# Patient Record
Sex: Female | Born: 1975
Health system: Southern US, Community
[De-identification: ages and names within clinical notes are randomized; demographics above are authoritative.]

## PROBLEM LIST (undated history)

## (undated) DIAGNOSIS — R030 Elevated blood-pressure reading, without diagnosis of hypertension: Secondary | ICD-10-CM

## (undated) DIAGNOSIS — F419 Anxiety disorder, unspecified: Secondary | ICD-10-CM

## (undated) DIAGNOSIS — D649 Anemia, unspecified: Secondary | ICD-10-CM

## (undated) DIAGNOSIS — R21 Rash and other nonspecific skin eruption: Secondary | ICD-10-CM

## (undated) DIAGNOSIS — Z8614 Personal history of Methicillin resistant Staphylococcus aureus infection: Secondary | ICD-10-CM

## (undated) DIAGNOSIS — M1990 Unspecified osteoarthritis, unspecified site: Secondary | ICD-10-CM

## (undated) DIAGNOSIS — L732 Hidradenitis suppurativa: Secondary | ICD-10-CM

## (undated) DIAGNOSIS — Z8489 Family history of other specified conditions: Secondary | ICD-10-CM

## (undated) DIAGNOSIS — T783XXA Angioneurotic edema, initial encounter: Secondary | ICD-10-CM

## (undated) DIAGNOSIS — T7840XA Allergy, unspecified, initial encounter: Secondary | ICD-10-CM

## (undated) DIAGNOSIS — E669 Obesity, unspecified: Secondary | ICD-10-CM

## (undated) HISTORY — DX: Obesity, unspecified: E66.9

## (undated) HISTORY — DX: Anxiety disorder, unspecified: F41.9

## (undated) HISTORY — DX: Allergy, unspecified, initial encounter: T78.40XA

## (undated) HISTORY — PX: WISDOM TOOTH EXTRACTION: SHX21

## (undated) HISTORY — DX: Unspecified osteoarthritis, unspecified site: M19.90

## (undated) HISTORY — DX: Angioneurotic edema, initial encounter: T78.3XXA

## (undated) HISTORY — DX: Personal history of Methicillin resistant Staphylococcus aureus infection: Z86.14

## (undated) HISTORY — PX: UPPER GI ENDOSCOPY: SHX6162

---

## 1996-09-06 HISTORY — PX: CHOLECYSTECTOMY: SHX55

## 1999-09-30 ENCOUNTER — Inpatient Hospital Stay (HOSPITAL_COMMUNITY): Admission: EM | Admit: 1999-09-30 | Discharge: 1999-09-30 | Payer: Self-pay | Admitting: Emergency Medicine

## 1999-09-30 HISTORY — PX: INCISION AND DRAINAGE ABSCESS: SHX5864

## 2000-07-15 ENCOUNTER — Other Ambulatory Visit: Admission: RE | Admit: 2000-07-15 | Discharge: 2000-07-15 | Payer: Self-pay | Admitting: Obstetrics and Gynecology

## 2001-03-15 ENCOUNTER — Emergency Department (HOSPITAL_COMMUNITY): Admission: EM | Admit: 2001-03-15 | Discharge: 2001-03-15 | Payer: Self-pay | Admitting: Emergency Medicine

## 2001-03-16 ENCOUNTER — Inpatient Hospital Stay (HOSPITAL_COMMUNITY): Admission: RE | Admit: 2001-03-16 | Discharge: 2001-03-17 | Payer: Self-pay | Admitting: Surgery

## 2001-03-16 HISTORY — PX: INCISION AND DRAINAGE ABSCESS: SHX5864

## 2001-06-21 ENCOUNTER — Other Ambulatory Visit: Admission: RE | Admit: 2001-06-21 | Discharge: 2001-06-21 | Payer: Self-pay | Admitting: Obstetrics and Gynecology

## 2001-09-10 ENCOUNTER — Emergency Department (HOSPITAL_COMMUNITY): Admission: EM | Admit: 2001-09-10 | Discharge: 2001-09-11 | Payer: Self-pay | Admitting: Emergency Medicine

## 2002-05-07 ENCOUNTER — Ambulatory Visit (HOSPITAL_COMMUNITY): Admission: EM | Admit: 2002-05-07 | Discharge: 2002-05-07 | Payer: Self-pay | Admitting: Emergency Medicine

## 2002-05-07 HISTORY — PX: INCISION AND DRAINAGE ABSCESS: SHX5864

## 2003-05-27 ENCOUNTER — Encounter: Payer: Self-pay | Admitting: Emergency Medicine

## 2003-05-27 ENCOUNTER — Emergency Department (HOSPITAL_COMMUNITY): Admission: EM | Admit: 2003-05-27 | Discharge: 2003-05-27 | Payer: Self-pay | Admitting: Emergency Medicine

## 2004-04-08 ENCOUNTER — Other Ambulatory Visit: Admission: RE | Admit: 2004-04-08 | Discharge: 2004-04-08 | Payer: Self-pay | Admitting: Obstetrics and Gynecology

## 2004-12-07 ENCOUNTER — Emergency Department (HOSPITAL_COMMUNITY): Admission: EM | Admit: 2004-12-07 | Discharge: 2004-12-07 | Payer: Self-pay | Admitting: Emergency Medicine

## 2006-11-11 ENCOUNTER — Other Ambulatory Visit: Admission: RE | Admit: 2006-11-11 | Discharge: 2006-11-11 | Payer: Self-pay | Admitting: Obstetrics & Gynecology

## 2007-01-16 ENCOUNTER — Emergency Department (HOSPITAL_COMMUNITY): Admission: EM | Admit: 2007-01-16 | Discharge: 2007-01-16 | Payer: Self-pay | Admitting: Emergency Medicine

## 2007-04-25 ENCOUNTER — Ambulatory Visit (HOSPITAL_COMMUNITY): Admission: RE | Admit: 2007-04-25 | Discharge: 2007-04-25 | Payer: Self-pay | Admitting: Family Medicine

## 2007-09-07 DIAGNOSIS — Z8614 Personal history of Methicillin resistant Staphylococcus aureus infection: Secondary | ICD-10-CM

## 2007-09-07 HISTORY — DX: Personal history of Methicillin resistant Staphylococcus aureus infection: Z86.14

## 2007-11-16 ENCOUNTER — Other Ambulatory Visit: Admission: RE | Admit: 2007-11-16 | Discharge: 2007-11-16 | Payer: Self-pay | Admitting: Obstetrics & Gynecology

## 2008-07-08 ENCOUNTER — Encounter (INDEPENDENT_AMBULATORY_CARE_PROVIDER_SITE_OTHER): Payer: Self-pay | Admitting: Neurology

## 2008-07-08 ENCOUNTER — Inpatient Hospital Stay (HOSPITAL_COMMUNITY): Admission: AD | Admit: 2008-07-08 | Discharge: 2008-07-09 | Payer: Self-pay | Admitting: Neurology

## 2008-07-09 ENCOUNTER — Encounter (INDEPENDENT_AMBULATORY_CARE_PROVIDER_SITE_OTHER): Payer: Self-pay | Admitting: Neurology

## 2008-11-21 ENCOUNTER — Other Ambulatory Visit: Admission: RE | Admit: 2008-11-21 | Discharge: 2008-11-21 | Payer: Self-pay | Admitting: Obstetrics & Gynecology

## 2008-12-04 ENCOUNTER — Ambulatory Visit (HOSPITAL_COMMUNITY): Admission: RE | Admit: 2008-12-04 | Discharge: 2008-12-04 | Payer: Self-pay | Admitting: *Deleted

## 2009-11-03 ENCOUNTER — Ambulatory Visit (HOSPITAL_BASED_OUTPATIENT_CLINIC_OR_DEPARTMENT_OTHER): Admission: RE | Admit: 2009-11-03 | Discharge: 2009-11-03 | Payer: Self-pay | Admitting: Specialist

## 2009-11-03 HISTORY — PX: AXILLARY HIDRADENITIS EXCISION: SUR522

## 2010-01-26 ENCOUNTER — Ambulatory Visit (HOSPITAL_BASED_OUTPATIENT_CLINIC_OR_DEPARTMENT_OTHER): Admission: RE | Admit: 2010-01-26 | Discharge: 2010-01-26 | Payer: Self-pay | Admitting: Specialist

## 2010-01-26 HISTORY — PX: AXILLARY HIDRADENITIS EXCISION: SUR522

## 2010-09-27 ENCOUNTER — Encounter: Payer: Self-pay | Admitting: *Deleted

## 2010-09-27 ENCOUNTER — Encounter: Payer: Self-pay | Admitting: Neurology

## 2010-11-23 LAB — CBC
HCT: 35.9 % — ABNORMAL LOW (ref 36.0–46.0)
Hemoglobin: 11.6 g/dL — ABNORMAL LOW (ref 12.0–15.0)
MCHC: 32.5 g/dL (ref 30.0–36.0)
MCV: 82.2 fL (ref 78.0–100.0)
Platelets: 286 10*3/uL (ref 150–400)
RDW: 17 % — ABNORMAL HIGH (ref 11.5–15.5)

## 2010-11-23 LAB — DIFFERENTIAL
Basophils Absolute: 0 10*3/uL (ref 0.0–0.1)
Basophils Relative: 0 % (ref 0–1)
Eosinophils Absolute: 0.2 10*3/uL (ref 0.0–0.7)
Eosinophils Relative: 3 % (ref 0–5)
Lymphocytes Relative: 40 % (ref 12–46)
Monocytes Absolute: 0.5 10*3/uL (ref 0.1–1.0)

## 2010-11-23 LAB — BASIC METABOLIC PANEL
BUN: 9 mg/dL (ref 6–23)
CO2: 26 mEq/L (ref 19–32)
GFR calc non Af Amer: >60 mL/min (ref >60)
Glucose, Bld: 93 mg/dL (ref 70–99)
Potassium: 4.5 mEq/L (ref 3.5–5.1)
Sodium: 137 mEq/L (ref 135–145)

## 2010-11-27 LAB — POCT HEMOGLOBIN-HEMACUE: Hemoglobin: 10.7 g/dL — ABNORMAL LOW (ref 12.0–15.0)

## 2011-01-06 LAB — HM MAMMOGRAPHY

## 2011-01-19 NOTE — Discharge Summary (Signed)
[name], [name]             ACCOUNT NO.:  [id number]   MEDICAL RECORD NO.:  [id number]          PATIENT TYPE:  INP   LOCATION:  3034                         FACILITY:  [facility]   PHYSICIAN:  Pramod P. Pearlean Brownie, MD    DATE OF BIRTH:  Jan 07, 1976   DATE OF ADMISSION:  07/08/2008  DATE OF DISCHARGE:  07/09/2008                               DISCHARGE SUMMARY   DIAGNOSIS AT TIME OF DISCHARGE:  1. Complicated migraine status post intravenous tissue plasminogen      activator for stroke-like symptoms.  2. Obesity.  3. Mild hyperlipidemia.  4. Gastroesophageal reflux disease.  5. History of migraine.  6. History of anxiety attack about 5 years ago.  7. Cholecystectomy with drainage of abscess.   MEDICINES AT TIME OF DISCHARGE:  NuvaRing for birth control and aspirin  81 mg a day.   STUDIES PERFORMED:  1. CT of the brain on admission showed no acute abnormality.  2. MRI of the brain shows no acute stroke.  There is mild-to-moderate      inflammatory thickening in mucosa and ethmoid air cells.  3. MRA of the brain shows severely decreased caliber involving the      right anterior cerebral artery, A1 segment.  Moderate decreased      caliber involving the superior division of the right middle      cerebral artery proximally.  Atherosclerosis versus vasculitis.  4. Carotid Doppler shows bilateral 40-60% distal internal carotid      artery stenosis by velocity only.  No plaque morphology.  Tortuous      distal internal carotid vessels probably reason for increased      velocities.  Vertebral artery flow was antegrade.  5. A 2-D echocardiogram performed, results pending.  6. EKG shows normal sinus rhythm.   LABORATORY STUDIES:  Cholesterol 194, triglycerides 92, HDL 66, LDL 110.  Hemoglobin A1c 5.9.  Cardiac enzymes negative.  Chemistry with glucose  119, potassium 3.3, otherwise, normal.  Coagulation studies were normal.  CBC with hemoglobin 11.8, otherwise, normal albumin was 2.8.   Urinalysis  was 7-10 white blood cells, 0-2 red blood cells, few bacteria.  No  leukocyte esterase noted.  Urine drug screen negative.  Urine pregnancy  test negative, contains albumin to a 2.8.   HISTORY OF PRESENT ILLNESS:  [name] is a 35 year old obese  African American female with no other past medical history.  The patient  states she was awake about 5:15 and was going through her usual routine.  She said she was on the toilet at 6:30.  When she reached for the toilet  paper, found that her hand was clumsy to the point she was unable to  manipulate the paper wipe herself.  She called for her husband who found  that her speech was slurring, as she was having difficulty finding  words.  She was brought to [facility] Emergency Department about 8:00  a.m. where her speech was somewhat improved but had persistent symptoms  of weakness in the right upper extremity.  She said she had a little bit  of numbness in the  right foot, but no obvious weakness was present in  her gait.  She denied any history of previous symptoms.  She is now  developing a posterior headache, which is not throbbing and not  associated with nausea or vomiting.  She has a history of migraines and  feels the pain is a little bit different than in her past migraines.  Her CT of the head was negative for acute abnormalities.  She was felt  to be a t-PA candidate and full-dose IV t-PA was given.  She was  transferred to [facility] for further stroke evaluation.   HOSPITAL COURSE:  MRI was negative for acute stroke.  It was felt her  symptoms are likely that of a complicated migraine, as she does have a  history of migraines in the past with a recent medication administration  by her primary care physician, which she has not followed through with,  as it was a medicine labeled for depression and she was uncomfortable  taking this.  She had a complete stroke workup done.  No obvious source  of stroke or stroke  symptoms found.  We would like to do an outpatient  TCD bubble emboli monitoring study to look for patent foramen ovale,  which may or may not be associated with her migraines.  She was placed  on aspirin 81 mg a day in the hospital for primary stroke prevention and  asked to discuss use of NuvaRing and other estrogen-related birth  control products with her OB/GYN, as we would prefer she not have these  in case this ends up to be stroke related.  I have asked her to follow  up with her primary care physician, OB/GYN, and Dr. Pearlean Brownie for the TCD  bubble study.  She has the rehab therapy needs.   CONDITION AT DISCHARGE:  The patient is alert and oriented x3.  No  aphasia.  No dysarthria.  Eye movements are full.  Face is symmetric.  Tongue is midline.  She has no drift, no focal weakness.  Her deep  tendon reflexes are 3+.  Her plantars are down bilaterally.  Her heart  rate is regular.  Her breath sounds are clear.   DISCHARGE PLAN:  1. Discharge home with family.  2. No therapy needs.  3. Add aspirin 81 mg a day for stroke prevention.  4. Prefer no estrogen-containing birth control products.  I have asked      her to follow up with her OB/GYN and has followup with primary care      physician, Dr. Modena Jansky within 1 month.  5. Call Dr. Marlis Edelson office and schedule an outpatient TCD bubble study      and emboli monitoring within the next month.  She will see him at      that time.      Annie Main, N.P.    ______________________________  Sunny Schlein. Pearlean Brownie, MD    SB/MEDQ  D:  07/09/2008  T:  07/10/2008  Job:  [id number]   cc:   Dr. Alois Cliche

## 2011-01-19 NOTE — H&P (Signed)
[name], [name]             ACCOUNT NO.:  [id number]   MEDICAL RECORD NO.:  [id number]          PATIENT TYPE:  EMS   LOCATION:  ED                           FACILITY:  [facility]   PHYSICIAN:  Michael L. Reynolds, M.D.DATE OF BIRTH:  1976/01/24   DATE OF ADMISSION:  07/08/2008  DATE OF DISCHARGE:                              HISTORY & PHYSICAL   CHIEF COMPLAINT:  Code stroke with right-sided weakness and slurred  speech.   HISTORY OF PRESENT ILLNESS:  This is one of the several [facility] admissions in the first Stroke Service admission for  this 35 year old woman with little past medical history.  She states  that she was awake this morning at about 5:15 and was going through the  usual routine.  She said she was on the toilet at 6:30, when she reached  the toilet paper, found that her hand was clumsy to the point that she  was unable to manipulate the paper and wipe herself.  She called for her  husband, does found that her speech was slurring and she was having  difficulty finding words.  She was brought to [facility] Emergency  Department about 8 a.m. where her speech was somewhat better, but she  had persistent symptoms of weakness in the right upper extremity.  She  said that she had little bit of numbness in the right foot, but no  obvious weakness and was able to walk normally.  She denied any history  of previous similar symptoms.  She now is developing a posterior  headache, which is not throbbing and not associated with nausea or  vomiting.  She has had migraines in the past, but feels the pain is a  little bit different from her past migraines.  She denies chest pain,  palpitations, shortness of breath, or altered level of consciousness.   PAST MEDICAL HISTORY:  Remarkable for gastroesophageal reflux disease,  which she is on medications.  She is morbidly obese.  She has history of  migraines in the past, although these have not bothered her very  much  recently.  She says about 5 years ago she developed some arm numbness,  and was brought to the ED for possible stroke, but was diagnosed with  anxiety attack and sent home.  She says her migraines have been severe  enough that she has passed out in the past.  She had a cholecystectomy  in the past, and subsequently had few umbilical abscess which have been  surgically drained.   FAMILY HISTORY:  Negative for any premature history of coronary artery  disease or stroke.   SOCIAL HISTORY:  She relaxes with alcohol, denies any history of tobacco  or illicit drug use.  She lives with her husband and is normally  independent on her activities of daily living.   ALLERGIES:  No known drug allergies.   MEDICATIONS:  Protonix.   REVIEW OF SYSTEMS:  Headache as above, some anxiety symptoms have  started, beyond that full 10 system review of systems is negative except  as outlined in the HPI and in the  accompanying admission nursing record.   PHYSICAL EXAMINATION:  VITAL SIGNS:  Temperature 98.2, blood pressure  147/88, pulse 95, and respirations 18.  GENERAL:  This is a morbidly obese, healthy-appearing woman, anxious and  tearful, supine in the hospital bed, and no evident distress.  HEENT:  Head, cranium is normocephalic and atraumatic.  Oropharynx  benign.  NECK:  Supple without carotid or supraclavicular bruits.  HEART:  Regular rate without murmurs.  CHEST:  Clear to auscultation bilaterally.  ABDOMEN:  Obese and soft.  Normoactive bowel sounds.  EXTREMITIES:  No edema.  NEUROLOGICAL:  Mental status, she is awake and alert.  She is oriented  to place and time.  She is able to name objects and repeat phrases  without difficulty.  She is able to follow 1- to 2-step commands.  Mood  is anxious and affect is congruent with that.  CRANIAL NERVES:  Pupils are equal and reactive.  Extraocular movements  are full without nystagmus.  Visual fields are full to confrontation.  Hearing is  intact to conversation and speech.  Facial sensation is  intact to pinprick.  Face, tongue, and palate move normally and  symmetrically.  Motor, normal bulk and tone.  She has slight drift to  the right upper extremity, otherwise no drift.  In the right hand, she  has weakness which is fairly inconsistent in character.  Sensory, she  reports intact pinprick sensation in all extremities.  Double  stimulation is intact.  Coordination and rapid movements are performed  well on the left and cannot to be performed well on the right.  She  performs finger-to-nose well on the left, performs it on the right  without ataxia although the hand and fingers droop.  Reflexes are brisk  throughout.  Toes are downgoing bilaterally.  Gait, she is able to walk  with some assistant from her husband.   LABORATORY REVIEW:  CBC; white count 6.6, hemoglobin 11.9, and platelets  342,000.  Chemistries are remarkable for limited elevated glucose of  103, normal electrolytes, normal BUN and creatinine, normal liver  functions.  Cardiac enzymes are negative.  Coags are normal.  EKG is  normal.  CT of the head first reviewed is normal.  Urine pregnancy test  is negative.   IMPRESSION:  Acute right hand weakness and clumsiness, possible stroke  versus complicated migraine versus anxiety.   PLAN:  We will administer TPA, then treat with the usual post TPA  protocol.  She needs an MRI of the brain and a routine stroke workup.  If the MRI is negative, consider migraine or other source of the focal  symptoms.  Stroke Service to follow.      Michael L. Thad Ranger, M.D.  Electronically Signed     MLR/MEDQ  D:  07/08/2008  T:  07/09/2008  Job:  [id number]

## 2011-01-22 NOTE — Op Note (Signed)
[facility]  Patient:    [name], [name]                        MRN: [id number] Proc. Date: 03/16/01 Adm. Date:  [id number] Disc. Date: [id number] Attending:  Molpus, Carlisle Beers CC:         Lilly Cove, M.D.   Operative Report  DATE OF BIRTH:  [birth year].  PREOPERATIVE DIAGNOSIS:  Infraumbilical abscess.  POSTOPERATIVE DIAGNOSIS:  Infraumbilical abscess approximately 6 cm in diameter.  PROCEDURE:  Incision and drainage of infraumbilical abscess.  SURGEON:  Sandria Bales. Ezzard Standing, M.D.  ANESTHESIA:  General endotracheal.  INDICATION FOR PROCEDURE:  [name] is a 35 year old black female who had a laparoscopic cholecystectomy in [place], [place] in November 1998.  She developed an infraumbilical abscess, drained by Vikki Ports, M.D., in January 2001.  The cause of this abscess is not clear, but she now re-presented with another infraumbilical abscess and was seen by Dr. _____ in the urgent clinic, and I was on call and took care of the patient.  DESCRIPTION OF PROCEDURE:  Patient placed in a supine position.  After a general endotracheal anesthetic supervised by Lucille Passy, M.D., the abdomen was prepped with Betadine solution and sterilely draped.  I used an 18-gauge needle to probe and find the abscess cavity.  I actually excised her old infraumbilical scar pretty much, got down into a cavity, which was probably about 5-6 cm in diameter, with maybe 40-50 cc of foul-smelling yellow pus.  Cultures were obtained and sent.  I then irrigated the wound out, found no obvious foreign body within this, packed it with Betadine-soaked gauze, and then sterilely dressed it.  The patient tolerated the procedure well, was transported to the recovery room in good condition.  Sponge and needle count correct at the end of the case. DD:  03/16/01 TD:  03/17/01 Job: [id number]

## 2011-01-22 NOTE — H&P (Signed)
[facility]  Patient:    [name], [name]                        MRN: [id number] Adm. Date:  [id number] Disc. Date: [id number] Attending:  Molpus, Carlisle Beers CC:         Lilly Cove, M.D.   History and Physical  DATE OF BIRTH:  01/17/1976  HISTORY OF PRESENT ILLNESS:  This is a 35 year old black female who has been seen formerly in our office by Dr. Danna Hefty in January of 2001.  Her history is that in November of 1998 while living in [place], [place], she underwent a laparoscopic cholecystectomy. As best described, this was an eventful operation. She did well until January of 2001 when she presented with a periumbilical abscess and which Dr. Luan Pulling took her to the operating room and had to do an incision and drainage of this abscess. This abscess eventually healed. She has had no symptoms until the last day or two when she had increasing infraumbilical pain again. She went to the emergency room, I think yesterday, and was given some oral antibiotics but had worsening pain and saw Dr. Karilyn Cota this morning who put her on Augmentin and then asked her to be seen in the Urgent Clinic. She was seen by Dr. Lindie Spruce in the Urgent Clinic. He called me and asked me to see her and take her to the operating room tonight.  She has no other GI symptoms, no history of peptic ulcer disease, liver disease, pancreatic disease or change in bowel habits. Her only prior abdominal surgery was this laparoscopic cholecystectomy in 1998.  ALLERGIES:  She has no allergies.  MEDICATIONS:  She is on no medicines other than her acute medicines.  REVIEW OF SYSTEMS:  Negative for significant pulmonary, cardiac, gastrointestinal and urologic problems.  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  PHYSICAL EXAMINATION:  VITAL SIGNS:  Temperature 98.3, pulse 110, blood pressure 130/70, respirations 18.  GENERAL:  She is a well-nourished,  mildly obese black female, alert and cooperative on physical examination.  HEENT:  Unremarkable.  NECK:  Supple.  LUNGS:  Clear to auscultation.  HEART:  Has a regular rate and rhythm.  BREASTS:  I did not examine her breasts.  ABDOMEN:  Obese. On the lower umbilicus, she has about a 7-8 cm firm tender mass with a scar which is transverse below the umbilicus. The transverse scar is about 4-5 cm in length. She has not other palpable abdominal masses but certainly consistent with an infection probably in an old infraumbilical incision. The etiology is still unclear.  EXTREMITIES:  She has good strength in all four extremities.  IMPRESSION:  Infraumbilical abscess. I spoke to the patient about going to the operating room. She really could not tolerate this thing being drained in our office and I think we will need general anesthesia to drain this. Will probably plan to keep her overnight since it is already late in the evening getting started with plans to be discharged home tomorrow with home health care assisting with her wound care. DD:  03/16/01 TD:  03/17/01 Job: [id number]

## 2011-01-22 NOTE — H&P (Signed)
NAME:  [name] NO.:  [id number]   MEDICAL RECORD NO.:  [id number]                   PATIENT TYPE:  EMS   LOCATION:  ED                                   FACILITY:  [facility]   PHYSICIAN:  Angelia Mould. Derrell Lolling, M.D.             DATE OF BIRTH:  Dec 16, 1975   DATE OF ADMISSION:  05/07/2002  DATE OF DISCHARGE:                                HISTORY & PHYSICAL   CHIEF COMPLAINT:  Pain and swelling at the umbilicus.   HISTORY OF PRESENT ILLNESS:  This is a 35 year old black female, who has had  previous infections at her umbilicus, and presents to the [facility] ER  with what appears to be another incisional abscess at the umbilicus.   She underwent a laparoscopic cholecystectomy in November 1998, while living  in [place] and apparently this was uneventful.  She presented with  a periumbilical abscess in January 2001, and Dr. Luan Pulling took her to the  operating room and drained this, and this eventually healed.  She came back  on March 16, 2001, and saw Dr. Ovidio Kin with a recurrent abscess, and he  took her to the operating room on March 16, 2002.  This eventually healed.  She now presents with a two day history of swelling and pain and redness  around the umbilicus similar to previous episodes.  She has not had any  fever or chills.  I was asked to see her by Dr. Effie Shy, in the emergency  room.   PAST HISTORY:  1. Laparoscopic cholecystectomy in 1998.  2. Incision and drainage of umbilical incisional abscess, 2001.  3. Incision and drainage of umbilical incisional abscess July 2002.  4. Morbid obesity.  5. She has no other medical problems, specifically denies hypertension or     diabetes, or sickle cell trait or disease.  She denies asthma.   CURRENT MEDICATIONS:  None.   DRUG ALLERGIES:  None known.   SOCIAL HISTORY:  The patient is an unemployed Airline pilot.  She lives in  [place].  She is single, lives alone, and has no  children.  Denies the  use of alcohol or tobacco.   FAMILY HISTORY:  Mother living, has hypertension.  Father living and well.  There is colon cancer and lung cancer in more remote relatives.   REVIEW OF SYSTEMS:  All systems are reviewed and are negative except as  described above.   PHYSICAL EXAMINATION:  GENERAL:  A morbidly obese black female, very  pleasant, in no distress.  VITAL SIGNS:  Temp. 98.6, pulse 104, respirations 18, blood pressure 133/85.  HEENT:  Sclerae clear.  Extraocular movements intact.  Oropharynx clear  without lesions.  NECK:  Supple, nontender.  No mass.  No bruits.  LUNGS:  Clear to auscultation.  HEART:  Regular rate and rhythm.  No murmur.  BREASTS:  Not examined.  ABDOMEN:  Morbidly obese and soft  and nontender with active bowel sounds.  The only abnormality is in the periumbilical region.  There are multiple  hypertrophied scars below the umbilicus.  There is erythema surrounding this  area about 8-10 cm, and there is about a 3-4 cm complex fluctuant area.  There is no skin necrosis.  EXTREMITIES:  No edema.  Good pulses.  NEUROLOGIC:  Within normal limits.   IMPRESSION:  Recurrent incisional abscess, periumbilical.   PLAN:  The patient will be taken to the operating room for exploration and  drainage of her umbilical abscess.   I have discussed the indications and details of surgery with her.  Risks and  complications have been outlined, including but not limited to, bleeding;  recurrent infection, which is certainly a risk because of her history and  obesity; injury to adjacent structures such as the intestinal tract which is  unlikely to possible, and other unperceived problems.  She seems to  understand these issues well.  At this time, all of her questions are  answered.  She is in full agreement with this plan.                                               Angelia Mould. Derrell Lolling, M.D.    HMI/MEDQ  D:  05/07/2002  T:  05/07/2002  Job:   [id number]   cc:   Wilson Singer, M.D.

## 2011-01-22 NOTE — Op Note (Signed)
[place]  Patient:    [name]                         MRN: [id number] Proc. Date: 09/30/99 Adm. Date:  [id number] Disc. Date: [id number] Attending:  Stephenie Acres                           Operative Report  PREOPERATIVE DIAGNOSIS:  Abdominal wall mass, rule out hernia.  POSTOPERATIVE DIAGNOSIS:  Periumbilical abscess.  OPERATION PERFORMED:  Incision and drainage of periumbilical abscess.  SURGEON:  Stephenie Acres, M.D.  ANESTHESIA:  General.  DESCRIPTION OF PROCEDURE:  The patient was taken to the operating room and placed in supine position.  After adequate general anesthesia was induced the abdomen as prepped and draped in normal sterile fashion.  Using a transverse infraumbilical incision, dissecting down onto the mass, I exposed an area of significant purulence.  This was drained widely and packed.  The skin was closed loosely with staples.  A sterile dressing was applied.  The patient tolerated the procedure ell and went to PACU in good condition. DD:  10/30/99 TD:  10/30/99 Job: [id number]

## 2011-01-22 NOTE — Op Note (Signed)
NAME:  Doristine Church NO.:  [id number]   MEDICAL RECORD NO.:  [id number]                   PATIENT TYPE:  INP   LOCATION:  0101                                 FACILITY:  [facility]   PHYSICIAN:  Angelia Mould. Derrell Lolling, M.D.             DATE OF BIRTH:  December 20, 1975   DATE OF PROCEDURE:  05/07/2002  DATE OF DISCHARGE:                                 OPERATIVE REPORT   PREOPERATIVE DIAGNOSIS:  Recurrent abdominal wall abscess.   POSTOPERATIVE DIAGNOSIS:  Recurrent abdominal wall abscess.   OPERATION PERFORMED:  Incision and drainage of abdominal wall abscess.   SURGEON:  Angelia Mould. Derrell Lolling, M.D.   OPERATIVE INDICATIONS:  This is a 35 year old white female who underwent a  laparoscopic cholecystectomy in 1998 in [place].  She developed an  abscess in her umbilical incision in 2001, and this was drained by Dr.  Luan Pulling (that healed).  She developed another abscess in the umbilical  incision in July 2002, and that was drained by Dr. Ovidio Kin.  That  incision also healed.   She presented today with a two-day history of redness, pain and swelling in  the periumbilical area.  On examination she has another abscess.  The scar  is complex and hypertrophied.  She is brought to the operating room for  exploration and drainage of her complex abdominal wall abscess under general  anesthesia.   OPERATIVE TECHNIQUE:  Following the induction of general endotracheal  anesthesia, the patient's abdomen was prepped and draped in a sterile  fashion.  Marcaine 0.5% with epinephrine was used as a local supplemental  infiltration anesthetic.  A curved transverse incision was made in the  infraumbilical area, excising part of the old scar.  I entered a large  abscess cavity, which was cultured.  I explored the wound thoroughly,  looking for foreign bodies -- I did not find any for sure.  There were  several fragments of black tissue, but I could not tell whether this  was  thrombus or old silk suture material.  The fascia was intact.  The wound was  irrigated with saline.  Bleeders were cauterized with cautery and hemostasis  was good.  After thoroughly irrigating and thoroughly cauterizing the wound,  we packed the wound with saline-moistened fine mesh gauze and covered with a  bulky absorbant bandage.  The patient tolerated the procedure well and was  taken to the recovery room in stable condition.   ESTIMATED BLOOD LOSS:  About 20 cc.   COMPLICATIONS:  None.   COUNTS:  Sponge, needle and instrument counts were correct.                                               Angelia Mould. Derrell Lolling, M.D.   HMI/MEDQ  D:  05/07/2002  T:  05/07/2002  Job:  [id number]   cc:   Wilson Singer, M.D.

## 2011-01-22 NOTE — Letter (Signed)
December 12, 2008     RE:   GRAHAM, DOUKAS  MR#   [id number]  ACC#        [id number]   MFM CONSULTATION REPORT   Dear Dr. Hyacinth Meeker:   I first want to thank you for extending your patient, [name] to  me for a prepregnancy consultation.  As you know, she is a 35 year old  gravida 0 female with a interesting history of severe migraine headache.  The patient's history is the following.  The patient was diagnosed with  severe migraine headache in November 2009.  At that time, she was  admitted to the [facility] when she noticed a sudden onset of  clumsiness and weakness in her right hand as well as some slurred speech  and difficulty finding words.  She was worked up by Neurology including  an MRI scan of the brain and was subsequently diagnosed with a  complicated migraine episode.  She was placed on Topamax 50 mg per day  for her headaches and reports that the Topamax has worked quite well for  controlling her headaches.  She reports that she also have been tried on  Imitrex in the past without significant improvement of her migraine  headache.  Per the patient's report when she does have onset of severe  migraine headaches, they are extremely debilitating to her.   PAST MEDICAL HISTORY:  Otherwise, significant for obesity, some  borderline hyperlipidemia, and gastroesophageal reflux disease for which  she takes Protonix.   PAST SURGICAL HISTORY:  Gallbladder removal.   MEDICATIONS:  1. NuvaRing which she plans to have removed.  2. Topamax 50 mg nightly.  3. Protonix 40 mg daily.  4. Low-dose baby aspirin 81 mg a day.  5. Folic acid supplementation.   SOCIAL HISTORY:  She is employed full time.  She is married and does not  smoke or drink excessive alcohol beverages.   I had a long discussion with her concerning the management of migraine  headaches during pregnancy.  I discussed with her that I clearly do not  feel as though her history of severe migraine headache  would be viewed  as a contraindication to undertaking a subsequent pregnancy.  She  reports that having being told by a nurse practitioner that she should  consider never undertaking a pregnancy due to her history of severe  migraine headache.  In addition, I talked to her about general  management of migraine headaches during pregnancy and that we have  multiple options that we could use in treatment of migraine headaches  during pregnancy.  I discussed that narcotics are not contraindicated,  but would not be the first-line therapy which we would use.  In  addition, we could attempt to control migraine headaches with  nonsteroidal antiinflammatory agents up through the third trimester and  also can use Fioricet.  I discussed that ergotamine is absolutely  contraindicated during pregnancy.  We did have a long discussion  concerning the potential to stay on the Topamax during any subsequent  pregnancy.  She reports that there has been discussion of stopping the  Topamax during any subsequent pregnancy.  I gave her information from  [employer] discussing the use of this drug during pregnancy.  Specifically, we discussed that Topamax has been noted to produce  abnormal pregnancy outcomes in experimental animals, but, however, human  case reports and case series have not shown a clear increase in the  enhancement of congenital anomalies; however, the experience with this  drug is as of yet quite limited.  I did discuss that should she  successfully conceive, we would want to do an up-to-date literature  search to see if there is any further information about Topamax during  pregnancy.  Based upon the severity of her symptoms and the apparent  utility of Topamax with her, it would be my advice to keep her on the  Topamax during the pregnancy acknowledging that there is a theoretical  risk of malformations with the drug, the absolute risk of that being  probably low but not completely defined.   The other alternative is for  her to come off the Topamax during the first trimester and then to  resume it in the second trimester.  I did discuss with her that some  women actually reports improved symptoms of migraine headaches during  the pregnancy and that we managed many patients with severe migraines  during the gestation.   It will be my anticipation that we would co-manage her pregnancy along  with her Neurology should she decide to conceive.  By understanding that  she will have the NuvaRing removed soon and I did recommend that she use  barrier forms of contraception until a clear management plan has been  outlined.  I encouraged her to call with subsequent questions and I  believe all of her questions were answered during my consultation.  If  you have any questions or would like further information about my  thoughts on the management of any subsequent pregnancy, please feel free  to contact me.   Sincerely,      Felipa Eth, MD      DCM/MEDQ  D:  12/12/2008  T:  12/13/2008  Job:  [id number]

## 2011-06-08 LAB — COMPREHENSIVE METABOLIC PANEL
Albumin: 3 — ABNORMAL LOW
Alkaline Phosphatase: 72
BUN: 8
CO2: 24
Chloride: 109
Creatinine, Ser: 0.87
GFR calc non Af Amer: >60
Potassium: 4
Total Bilirubin: 0.4

## 2011-06-08 LAB — PROTIME-INR: Prothrombin Time: 12.5

## 2011-06-08 LAB — DIFFERENTIAL
Basophils Absolute: 0.1
Basophils Relative: 2 — ABNORMAL HIGH
Eosinophils Relative: 2
Lymphocytes Relative: 36
Monocytes Absolute: 0.4
Neutro Abs: 3.6

## 2011-06-08 LAB — RAPID URINE DRUG SCREEN, HOSP PERFORMED
Barbiturates: NOT DETECTED
Opiates: NOT DETECTED

## 2011-06-08 LAB — URINALYSIS, ROUTINE W REFLEX MICROSCOPIC
Bilirubin Urine: NEGATIVE
Glucose, UA: NEGATIVE
Hgb urine dipstick: NEGATIVE
Specific Gravity, Urine: 1.008

## 2011-06-08 LAB — CBC
HCT: 36.9
Hemoglobin: 11.9 — ABNORMAL LOW
MCHC: 32.2
MCV: 82.2
Platelets: 342
RDW: 15.8 — ABNORMAL HIGH

## 2011-06-08 LAB — URINE MICROSCOPIC-ADD ON

## 2011-06-08 LAB — CK TOTAL AND CKMB (NOT AT ARMC): CK, MB: 0.5

## 2011-06-08 LAB — GLUCOSE, CAPILLARY

## 2011-06-09 LAB — LIPID PANEL
HDL: 66
Triglycerides: 92
VLDL: 18

## 2011-06-09 LAB — PROTIME-INR: Prothrombin Time: 13.8

## 2011-06-09 LAB — CARDIAC PANEL(CRET KIN+CKTOT+MB+TROPI)
CK, MB: 0.5
Relative Index: INVALID
Total CK: 80

## 2011-06-09 LAB — HEMOGLOBIN A1C
Hgb A1c MFr Bld: 5.9
Mean Plasma Glucose: 123

## 2011-06-09 LAB — COMPREHENSIVE METABOLIC PANEL
Albumin: 2.8 — ABNORMAL LOW
BUN: 6
Creatinine, Ser: 0.85
Glucose, Bld: 119 — ABNORMAL HIGH
Total Bilirubin: 0.5
Total Protein: 6.4

## 2011-06-09 LAB — CBC
Platelets: 299
RBC: 4.3
RDW: 15.4
WBC: 8.1

## 2011-06-09 LAB — HOMOCYSTEINE: Homocysteine: 7.8

## 2011-06-09 LAB — APTT: aPTT: 29

## 2011-11-05 ENCOUNTER — Emergency Department (HOSPITAL_COMMUNITY): Payer: No Typology Code available for payment source

## 2011-11-05 ENCOUNTER — Emergency Department (HOSPITAL_COMMUNITY)
Admission: EM | Admit: 2011-11-05 | Discharge: 2011-11-05 | Disposition: A | Discharge status: Home / Self Care | Payer: No Typology Code available for payment source | Attending: Emergency Medicine | Admitting: Emergency Medicine

## 2011-11-05 ENCOUNTER — Encounter (HOSPITAL_COMMUNITY): Payer: Self-pay | Admitting: Emergency Medicine

## 2011-11-05 DIAGNOSIS — M25519 Pain in unspecified shoulder: Secondary | ICD-10-CM | POA: Diagnosis present

## 2011-11-05 DIAGNOSIS — R519 Headache, unspecified: Secondary | ICD-10-CM

## 2011-11-05 DIAGNOSIS — R51 Headache: Secondary | ICD-10-CM | POA: Insufficient documentation

## 2011-11-05 DIAGNOSIS — M25512 Pain in left shoulder: Secondary | ICD-10-CM

## 2011-11-05 DIAGNOSIS — M79609 Pain in unspecified limb: Secondary | ICD-10-CM | POA: Insufficient documentation

## 2011-11-05 MED ORDER — DIAZEPAM 5 MG PO TABS: 5.0000 mg | ORAL_TABLET | Freq: Two times a day (BID) | ORAL | Status: AC

## 2011-11-05 MED ORDER — IBUPROFEN 800 MG PO TABS: 800.0000 mg | ORAL_TABLET | Freq: Three times a day (TID) | ORAL | Status: AC

## 2011-11-05 MED ORDER — HYDROCODONE-ACETAMINOPHEN 5-325 MG PO TABS: 1.0000 | ORAL_TABLET | ORAL | Status: AC | PRN

## 2011-11-05 MED ORDER — OXYCODONE-ACETAMINOPHEN 5-325 MG PO TABS
1.0000 | ORAL_TABLET | Freq: Once | ORAL | Status: AC
  Administered 2011-11-05: 1 via ORAL
  Filled 2011-11-05: qty 1

## 2011-11-05 NOTE — ED Notes (Signed)
Reviewed D/C instructions and Rx for Ibuprofen, Norco and Valium. Voiced understanding. Escorted to D/C desk
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
## 2011-11-05 NOTE — ED Notes (Signed)
Urine collected and placed at bedside.  
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
## 2011-11-06 NOTE — ED Provider Notes (Signed)
Medical screening examination/treatment/procedure(s) were performed by non-physician practitioner and as supervising physician I was immediately available for consultation/collaboration.   Gwyneth Sprout, MD 11/06/11 [phone]

## 2011-11-07 ENCOUNTER — Emergency Department (INDEPENDENT_AMBULATORY_CARE_PROVIDER_SITE_OTHER)
Admission: EM | Admit: 2011-11-07 | Discharge: 2011-11-07 | Disposition: A | Discharge status: Home / Self Care | Payer: Self-pay | Source: Home / Self Care | Attending: Family Medicine | Admitting: Family Medicine

## 2011-11-07 ENCOUNTER — Encounter (HOSPITAL_COMMUNITY): Payer: Self-pay | Admitting: *Deleted

## 2011-11-07 DIAGNOSIS — S060XAA Concussion with loss of consciousness status unknown, initial encounter: Secondary | ICD-10-CM

## 2011-11-07 DIAGNOSIS — R51 Headache: Secondary | ICD-10-CM

## 2011-11-07 DIAGNOSIS — S060X9A Concussion with loss of consciousness of unspecified duration, initial encounter: Secondary | ICD-10-CM
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
## 2012-01-12 ENCOUNTER — Ambulatory Visit: Payer: No Typology Code available for payment source | Attending: Family Medicine | Admitting: Physical Therapy

## 2012-01-12 DIAGNOSIS — IMO0001 Reserved for inherently not codable concepts without codable children: Secondary | ICD-10-CM | POA: Insufficient documentation

## 2012-01-12 DIAGNOSIS — M546 Pain in thoracic spine: Secondary | ICD-10-CM | POA: Insufficient documentation

## 2012-01-17 ENCOUNTER — Ambulatory Visit: Payer: No Typology Code available for payment source | Admitting: Physical Therapy

## 2012-01-18 ENCOUNTER — Ambulatory Visit: Payer: No Typology Code available for payment source

## 2012-01-26 ENCOUNTER — Ambulatory Visit: Payer: No Typology Code available for payment source

## 2012-01-27 ENCOUNTER — Ambulatory Visit: Payer: No Typology Code available for payment source

## 2012-02-01 ENCOUNTER — Ambulatory Visit: Payer: No Typology Code available for payment source

## 2012-02-02 ENCOUNTER — Ambulatory Visit: Payer: No Typology Code available for payment source

## 2012-02-07 ENCOUNTER — Ambulatory Visit: Payer: No Typology Code available for payment source | Attending: Family Medicine | Admitting: Physical Therapy

## 2012-02-07 DIAGNOSIS — M546 Pain in thoracic spine: Secondary | ICD-10-CM | POA: Insufficient documentation

## 2012-02-07 DIAGNOSIS — IMO0001 Reserved for inherently not codable concepts without codable children: Secondary | ICD-10-CM | POA: Insufficient documentation

## 2012-02-07 DIAGNOSIS — M2569 Stiffness of other specified joint, not elsewhere classified: Secondary | ICD-10-CM | POA: Insufficient documentation

## 2012-02-09 ENCOUNTER — Ambulatory Visit: Payer: No Typology Code available for payment source

## 2012-02-14 ENCOUNTER — Ambulatory Visit: Payer: No Typology Code available for payment source

## 2012-02-16 ENCOUNTER — Ambulatory Visit: Payer: No Typology Code available for payment source

## 2012-02-17 ENCOUNTER — Other Ambulatory Visit: Payer: Self-pay | Admitting: Family Medicine

## 2012-02-17 ENCOUNTER — Ambulatory Visit
Admission: RE | Admit: 2012-02-17 | Discharge: 2012-02-17 | Disposition: A | Discharge status: Home / Self Care | Payer: No Typology Code available for payment source | Source: Ambulatory Visit | Attending: Family Medicine | Admitting: Family Medicine

## 2012-02-17 DIAGNOSIS — R52 Pain, unspecified: Secondary | ICD-10-CM

## 2012-02-21 ENCOUNTER — Ambulatory Visit: Payer: No Typology Code available for payment source

## 2012-02-23 ENCOUNTER — Ambulatory Visit: Payer: No Typology Code available for payment source

## 2012-03-01 ENCOUNTER — Ambulatory Visit: Payer: No Typology Code available for payment source

## 2012-03-02 ENCOUNTER — Ambulatory Visit: Payer: No Typology Code available for payment source | Admitting: Physical Therapy

## 2012-06-20 ENCOUNTER — Other Ambulatory Visit: Payer: Self-pay | Admitting: Family Medicine

## 2012-06-20 DIAGNOSIS — M545 Low back pain, unspecified: Secondary | ICD-10-CM

## 2012-06-24 ENCOUNTER — Ambulatory Visit
Admission: RE | Admit: 2012-06-24 | Discharge: 2012-06-24 | Disposition: A | Discharge status: Home / Self Care | Payer: Self-pay | Source: Ambulatory Visit | Attending: Family Medicine | Admitting: Family Medicine

## 2012-06-24 DIAGNOSIS — M545 Low back pain, unspecified: Secondary | ICD-10-CM

## 2013-02-15 ENCOUNTER — Ambulatory Visit (INDEPENDENT_AMBULATORY_CARE_PROVIDER_SITE_OTHER): Payer: 59 | Admitting: Obstetrics & Gynecology

## 2013-02-15 ENCOUNTER — Encounter: Payer: Self-pay | Admitting: Obstetrics & Gynecology

## 2013-02-15 VITALS — BP 124/82 | HR 72 | Resp 16 | Ht 62.75 in | Wt 329.0 lb

## 2013-02-15 DIAGNOSIS — E669 Obesity, unspecified: Secondary | ICD-10-CM

## 2013-02-15 DIAGNOSIS — I10 Essential (primary) hypertension: Secondary | ICD-10-CM

## 2013-02-15 DIAGNOSIS — Z8614 Personal history of Methicillin resistant Staphylococcus aureus infection: Secondary | ICD-10-CM

## 2013-02-15 DIAGNOSIS — Z01419 Encounter for gynecological examination (general) (routine) without abnormal findings: Secondary | ICD-10-CM

## 2013-02-15 DIAGNOSIS — K219 Gastro-esophageal reflux disease without esophagitis: Secondary | ICD-10-CM

## 2013-02-15 DIAGNOSIS — G43909 Migraine, unspecified, not intractable, without status migrainosus: Secondary | ICD-10-CM | POA: Insufficient documentation

## 2013-02-15 DIAGNOSIS — IMO0001 Reserved for inherently not codable concepts without codable children: Secondary | ICD-10-CM

## 2013-02-15 DIAGNOSIS — L732 Hidradenitis suppurativa: Secondary | ICD-10-CM

## 2013-02-15 MED ORDER — NYSTATIN-TRIAMCINOLONE 100000-0.1 UNIT/GM-% EX CREA: TOPICAL_CREAM | Freq: Two times a day (BID) | CUTANEOUS | Status: DC

## 2013-02-15 NOTE — Addendum Note (Signed)
Addended by: Jerene Bears on: 02/15/2013 10:25 AM   Modules accepted: Orders

## 2013-02-15 NOTE — Patient Instructions (Signed)

## 2013-02-15 NOTE — Progress Notes (Signed)
37 y.o. G0P0 MarriedAfrican AmericanF here for annual exam.  Has been diagnosed with white coat hypertension.  Brought letter from PCP.  Husband has two MVAs in the last 13 months.  He is working full time, again.  She is not interested in talking about children this year.  Is contemplating bariatric surgery/more aggressive weight loss measures.  Got a promotion at work with 10% pay raise.  6th year wedding anniversary is next week.  Still contemplating pregnancy.    Patient's last menstrual period was 02/11/2013.          Sexually active: yes  The current method of family planning is condoms 100% of the time.    Exercising: no  not regularly Smoker:  no  Health Maintenance: Pap:  01/20/12 WNL/negative HR HPV History of abnormal Pap:  Yes patient states MMG:  01/06/11 bilateral diag mmg/left [place]- normal Colonoscopy:  none BMD:   none TDaP:  3/07  Screening Labs: PCP, Hb today: PCP, Urine today: PCP   reports that she has never smoked. She has never used smokeless tobacco. She reports that  drinks alcohol. She reports that she does not use illicit drugs.  Past Medical History  Diagnosis Date  . Migraine   . GERD (gastroesophageal reflux disease)   . Obesity   . History of MRSA infection     of skin  . Depression     h/o suicidal    Past Surgical History  Procedure Laterality Date  . Cholecystectomy    . Axillary hidradenitis excision      x2  . Abdominal exploration surgery    . Wisdom tooth extraction Bilateral 06/2005  . Abdominal wall abscess  03/2001    --drainage    Current Outpatient Prescriptions  Medication Sig Dispense Refill  . Ascorbic Acid (VITAMIN C PO) Take by mouth daily.      [name] aspirin 81 MG tablet Take 81 mg by mouth daily.      . Fish Oil OIL 5 mLs by Does not apply route daily.      [name] MAGNESIUM PO Take by mouth daily.      [name] RIBOFLAVIN PO Take by mouth daily.       No current facility-administered medications for this visit.    Family History   Problem Relation Age of Onset  . Prostate cancer Paternal Grandfather   . Hypertension Mother   . Thyroid disease Sister   . Hypertension Maternal Grandmother   . Diabetes Maternal Grandmother   . Diabetes Maternal Uncle   . Cancer Maternal Grandfather     ? lung  . Heart disease Maternal Grandmother     ROS:  Pertinent items are noted in HPI.  Otherwise, a comprehensive ROS was negative.  Exam:   BP 124/82  Pulse 72  Resp 16  Ht 5' 2.75" (1.594 m)  Wt 329 lb (149.233 kg)  BMI 58.73 kg/m2  LMP 02/11/2013  Weight change: +2   Height: 5' 2.75" (159.4 cm)  Ht Readings from Last 3 Encounters:  02/15/13 5' 2.75" (1.594 m)    General appearance: alert, cooperative and appears stated age Head: Normocephalic, without obvious abnormality, atraumatic Neck: no adenopathy, supple, symmetrical, trachea midline and thyroid normal to inspection and palpation Lungs: clear to auscultation bilaterally Breasts: normal appearance, no masses or tenderness Heart: regular rate and rhythm Abdomen: soft, non-tender; bowel sounds normal; no masses,  no organomegaly Extremities: extremities normal, atraumatic, no cyanosis or edema Skin: Skin color, texture, turgor normal. No rashes  or lesions Lymph nodes: Cervical, supraclavicular, and axillary nodes normal. No abnormal inguinal nodes palpated Neurologic: Grossly normal   Pelvic: External genitalia:  no lesions              Urethra:  normal appearing urethra with no masses, tenderness or lesions              Bartholins and Skenes: normal                 Vagina: normal appearing vagina with normal color and discharge, no lesions              Cervix: no lesions              Pap taken: no Bimanual Exam:  Uterus:  normal size, contour, position, consistency, mobility, non-tender              Adnexa: normal adnexa and no mass, fullness, tenderness               Rectovaginal: Confirms               Anus:  normal sphincter tone, no lesions  A:   Well Woman with normal exam H/O breast abscess x 2 in last year Remote hx of vulvar MRSA abscess 1/09 Morbid obesity  P:   Mammogram starting at age 14 pap smear neg with HR HPV one year ago.  No Pap today. Blood work with PCP.  Copies scanned into EPIC. return annually or prn  An After Visit Summary was printed and given to the patient.

## 2013-07-12 ENCOUNTER — Other Ambulatory Visit: Payer: Self-pay

## 2013-08-18 ENCOUNTER — Emergency Department (HOSPITAL_COMMUNITY)
Admission: EM | Admit: 2013-08-18 | Discharge: 2013-08-18 | Disposition: A | Discharge status: Home / Self Care | Payer: 59 | Attending: Emergency Medicine | Admitting: Emergency Medicine

## 2013-08-18 ENCOUNTER — Emergency Department (HOSPITAL_COMMUNITY): Payer: 59

## 2013-08-18 ENCOUNTER — Encounter (HOSPITAL_COMMUNITY): Payer: Self-pay | Admitting: Emergency Medicine

## 2013-08-18 DIAGNOSIS — E669 Obesity, unspecified: Secondary | ICD-10-CM | POA: Insufficient documentation

## 2013-08-18 DIAGNOSIS — M545 Low back pain, unspecified: Secondary | ICD-10-CM

## 2013-08-18 DIAGNOSIS — Z8719 Personal history of other diseases of the digestive system: Secondary | ICD-10-CM | POA: Insufficient documentation

## 2013-08-18 DIAGNOSIS — Z79899 Other long term (current) drug therapy: Secondary | ICD-10-CM | POA: Insufficient documentation

## 2013-08-18 DIAGNOSIS — Z8659 Personal history of other mental and behavioral disorders: Secondary | ICD-10-CM | POA: Insufficient documentation

## 2013-08-18 DIAGNOSIS — M546 Pain in thoracic spine: Secondary | ICD-10-CM

## 2013-08-18 DIAGNOSIS — Y9389 Activity, other specified: Secondary | ICD-10-CM | POA: Insufficient documentation

## 2013-08-18 DIAGNOSIS — Z8614 Personal history of Methicillin resistant Staphylococcus aureus infection: Secondary | ICD-10-CM | POA: Insufficient documentation

## 2013-08-18 DIAGNOSIS — G43909 Migraine, unspecified, not intractable, without status migrainosus: Secondary | ICD-10-CM | POA: Insufficient documentation

## 2013-08-18 DIAGNOSIS — R110 Nausea: Secondary | ICD-10-CM | POA: Insufficient documentation

## 2013-08-18 DIAGNOSIS — Y92410 Unspecified street and highway as the place of occurrence of the external cause: Secondary | ICD-10-CM | POA: Insufficient documentation

## 2013-08-18 DIAGNOSIS — IMO0002 Reserved for concepts with insufficient information to code with codable children: Secondary | ICD-10-CM | POA: Insufficient documentation

## 2013-08-18 DIAGNOSIS — Z7982 Long term (current) use of aspirin: Secondary | ICD-10-CM | POA: Insufficient documentation

## 2013-08-18 HISTORY — DX: Elevated blood-pressure reading, without diagnosis of hypertension: R03.0

## 2013-08-18 MED ORDER — DIAZEPAM 5 MG PO TABS
10.0000 mg | ORAL_TABLET | Freq: Once | ORAL | Status: AC
  Administered 2013-08-18: 10 mg via ORAL
  Filled 2013-08-18: qty 2

## 2013-08-18 MED ORDER — OXYCODONE-ACETAMINOPHEN 5-325 MG PO TABS
1.0000 | ORAL_TABLET | Freq: Once | ORAL | Status: AC
  Administered 2013-08-18: 1 via ORAL
  Filled 2013-08-18: qty 1

## 2013-08-18 MED ORDER — IBUPROFEN 600 MG PO TABS: 600.0000 mg | ORAL_TABLET | Freq: Four times a day (QID) | ORAL | Status: DC | PRN

## 2013-08-18 MED ORDER — ONDANSETRON 4 MG PO TBDP
4.0000 mg | ORAL_TABLET | Freq: Once | ORAL | Status: AC
  Administered 2013-08-18: 4 mg via ORAL
  Filled 2013-08-18: qty 1

## 2013-08-18 MED ORDER — KETOROLAC TROMETHAMINE 60 MG/2ML IM SOLN
60.0000 mg | Freq: Once | INTRAMUSCULAR | Status: AC
  Administered 2013-08-18: 60 mg via INTRAMUSCULAR
  Filled 2013-08-18: qty 2

## 2013-08-18 MED ORDER — CYCLOBENZAPRINE HCL 10 MG PO TABS: 10.0000 mg | ORAL_TABLET | Freq: Two times a day (BID) | ORAL | Status: DC | PRN

## 2013-08-18 NOTE — ED Provider Notes (Signed)
Medical screening examination/treatment/procedure(s) were performed by non-physician practitioner and as supervising physician I was immediately available for consultation/collaboration.  EKG Interpretation   None         Shanna Cisco, MD 08/18/13 1821
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
## 2014-01-04 DIAGNOSIS — L732 Hidradenitis suppurativa: Secondary | ICD-10-CM

## 2014-01-04 HISTORY — DX: Hidradenitis suppurativa: L73.2

## 2014-01-15 ENCOUNTER — Encounter (HOSPITAL_BASED_OUTPATIENT_CLINIC_OR_DEPARTMENT_OTHER): Payer: Self-pay | Admitting: *Deleted

## 2014-01-15 DIAGNOSIS — R21 Rash and other nonspecific skin eruption: Secondary | ICD-10-CM

## 2014-01-15 HISTORY — DX: Rash and other nonspecific skin eruption: R21

## 2014-01-21 ENCOUNTER — Encounter (HOSPITAL_BASED_OUTPATIENT_CLINIC_OR_DEPARTMENT_OTHER): Payer: 59 | Admitting: Anesthesiology

## 2014-01-21 ENCOUNTER — Encounter (HOSPITAL_BASED_OUTPATIENT_CLINIC_OR_DEPARTMENT_OTHER): Payer: Self-pay | Admitting: Anesthesiology

## 2014-01-21 ENCOUNTER — Ambulatory Visit (HOSPITAL_BASED_OUTPATIENT_CLINIC_OR_DEPARTMENT_OTHER)
Admission: RE | Admit: 2014-01-21 | Discharge: 2014-01-21 | Disposition: A | Discharge status: Home / Self Care | Payer: 59 | Source: Ambulatory Visit | Attending: Specialist | Admitting: Specialist

## 2014-01-21 ENCOUNTER — Encounter (HOSPITAL_BASED_OUTPATIENT_CLINIC_OR_DEPARTMENT_OTHER)
Admission: RE | Disposition: A | Discharge status: Home / Self Care | Payer: Self-pay | Source: Ambulatory Visit | Attending: Specialist

## 2014-01-21 ENCOUNTER — Ambulatory Visit (HOSPITAL_BASED_OUTPATIENT_CLINIC_OR_DEPARTMENT_OTHER): Payer: 59 | Admitting: Anesthesiology

## 2014-01-21 DIAGNOSIS — I10 Essential (primary) hypertension: Secondary | ICD-10-CM | POA: Insufficient documentation

## 2014-01-21 DIAGNOSIS — L732 Hidradenitis suppurativa: Secondary | ICD-10-CM | POA: Insufficient documentation

## 2014-01-21 DIAGNOSIS — K219 Gastro-esophageal reflux disease without esophagitis: Secondary | ICD-10-CM | POA: Insufficient documentation

## 2014-01-21 HISTORY — DX: Family history of other specified conditions: Z84.89

## 2014-01-21 HISTORY — DX: Rash and other nonspecific skin eruption: R21

## 2014-01-21 HISTORY — DX: Hidradenitis suppurativa: L73.2

## 2014-01-21 HISTORY — PX: HYDRADENITIS EXCISION: SHX5243

## 2014-01-21 HISTORY — DX: Anemia, unspecified: D64.9

## 2014-01-21 LAB — POCT HEMOGLOBIN-HEMACUE: HEMOGLOBIN: 12.7 g/dL (ref 12.0–15.0)

## 2014-01-21 SURGERY — EXCISION, HIDRADENITIS, AXILLA
Anesthesia: General | Laterality: Right

## 2014-01-21 MED ORDER — SODIUM CHLORIDE 0.9 % IV SOLN
INTRAVENOUS | Status: DC | PRN
  Administered 2014-01-21: 08:00:00 via INTRAMUSCULAR

## 2014-01-21 MED ORDER — LIDOCAINE HCL (CARDIAC) 20 MG/ML IV SOLN
INTRAVENOUS | Status: DC | PRN
  Administered 2014-01-21: 80 mg via INTRAVENOUS

## 2014-01-21 MED ORDER — FENTANYL CITRATE 0.05 MG/ML IJ SOLN
INTRAMUSCULAR | Status: AC
  Filled 2014-01-21: qty 4

## 2014-01-21 MED ORDER — OXYCODONE HCL 5 MG PO TABS
5.0000 mg | ORAL_TABLET | Freq: Once | ORAL | Status: AC | PRN
  Administered 2014-01-21: 5 mg via ORAL

## 2014-01-21 MED ORDER — MIDAZOLAM HCL 5 MG/5ML IJ SOLN
INTRAMUSCULAR | Status: DC | PRN
  Administered 2014-01-21: 2 mg via INTRAVENOUS

## 2014-01-21 MED ORDER — ONDANSETRON HCL 4 MG/2ML IJ SOLN
INTRAMUSCULAR | Status: DC | PRN
  Administered 2014-01-21: 4 mg via INTRAVENOUS

## 2014-01-21 MED ORDER — ONDANSETRON HCL 4 MG/2ML IJ SOLN: 4.0000 mg | Freq: Once | INTRAMUSCULAR | Status: DC | PRN

## 2014-01-21 MED ORDER — PROPOFOL 10 MG/ML IV BOLUS
INTRAVENOUS | Status: DC | PRN
  Administered 2014-01-21: 300 mg via INTRAVENOUS

## 2014-01-21 MED ORDER — HYDROMORPHONE HCL PF 1 MG/ML IJ SOLN
0.2500 mg | INTRAMUSCULAR | Status: DC | PRN
  Administered 2014-01-21: 0.5 mg via INTRAVENOUS

## 2014-01-21 MED ORDER — CEFAZOLIN SODIUM 1-5 GM-% IV SOLN
INTRAVENOUS | Status: AC
  Filled 2014-01-21: qty 50

## 2014-01-21 MED ORDER — HYDROMORPHONE HCL PF 1 MG/ML IJ SOLN
INTRAMUSCULAR | Status: AC
  Filled 2014-01-21: qty 1

## 2014-01-21 MED ORDER — DEXAMETHASONE SODIUM PHOSPHATE 4 MG/ML IJ SOLN
INTRAMUSCULAR | Status: DC | PRN
  Administered 2014-01-21: 10 mg via INTRAVENOUS

## 2014-01-21 MED ORDER — DEXTROSE 5 % IV SOLN
3.0000 g | INTRAVENOUS | Status: AC
  Administered 2014-01-21: 3 g via INTRAVENOUS

## 2014-01-21 MED ORDER — FENTANYL CITRATE 0.05 MG/ML IJ SOLN: 50.0000 ug | INTRAMUSCULAR | Status: DC | PRN

## 2014-01-21 MED ORDER — MIDAZOLAM HCL 2 MG/2ML IJ SOLN
INTRAMUSCULAR | Status: AC
  Filled 2014-01-21: qty 2

## 2014-01-21 MED ORDER — OXYCODONE HCL 5 MG PO TABS
ORAL_TABLET | ORAL | Status: AC
  Filled 2014-01-21: qty 1

## 2014-01-21 MED ORDER — MIDAZOLAM HCL 2 MG/ML PO SYRP: 12.0000 mg | ORAL_SOLUTION | Freq: Once | ORAL | Status: DC | PRN

## 2014-01-21 MED ORDER — LIDOCAINE-EPINEPHRINE 0.5 %-1:200000 IJ SOLN
INTRAMUSCULAR | Status: AC
  Filled 2014-01-21: qty 4

## 2014-01-21 MED ORDER — MIDAZOLAM HCL 2 MG/2ML IJ SOLN: 1.0000 mg | INTRAMUSCULAR | Status: DC | PRN

## 2014-01-21 MED ORDER — LACTATED RINGERS IV SOLN
INTRAVENOUS | Status: DC
  Administered 2014-01-21: 07:00:00 via INTRAVENOUS

## 2014-01-21 MED ORDER — SUCCINYLCHOLINE CHLORIDE 20 MG/ML IJ SOLN
INTRAMUSCULAR | Status: DC | PRN
  Administered 2014-01-21: 100 mg via INTRAVENOUS

## 2014-01-21 MED ORDER — FENTANYL CITRATE 0.05 MG/ML IJ SOLN
INTRAMUSCULAR | Status: DC | PRN
  Administered 2014-01-21: 100 ug via INTRAVENOUS
  Administered 2014-01-21: 25 ug via INTRAVENOUS

## 2014-01-21 MED ORDER — OXYCODONE HCL 5 MG/5ML PO SOLN: 5.0000 mg | Freq: Once | ORAL | Status: AC | PRN

## 2014-01-21 MED ORDER — CEFAZOLIN SODIUM-DEXTROSE 2-3 GM-% IV SOLR
INTRAVENOUS | Status: AC
  Filled 2014-01-21: qty 50

## 2014-01-21 SURGICAL SUPPLY — 61 items
BAG DECANTER FOR FLEXI CONT (MISCELLANEOUS) ×4 IMPLANT
BENZOIN TINCTURE PRP APPL 2/3 (GAUZE/BANDAGES/DRESSINGS) IMPLANT
BLADE KNIFE PERSONA 10 (BLADE) ×2 IMPLANT
BLADE KNIFE PERSONA 15 (BLADE) IMPLANT
BNDG COHESIVE 4X5 TAN STRL (GAUZE/BANDAGES/DRESSINGS) ×2 IMPLANT
BRIEF STRETCH FOR OB PAD LRG (UNDERPADS AND DIAPERS) IMPLANT
CANISTER SUCT 1200ML W/VALVE (MISCELLANEOUS) ×2 IMPLANT
COVER MAYO STAND STRL (DRAPES) ×2 IMPLANT
COVER TABLE BACK 60X90 (DRAPES) ×2 IMPLANT
DECANTER SPIKE VIAL GLASS SM (MISCELLANEOUS) IMPLANT
DRAIN CHANNEL 10M FLAT 3/4 FLT (DRAIN) IMPLANT
DRAIN CHANNEL 7F FF FLAT (WOUND CARE) IMPLANT
DRAPE EXTREMITY T 121X128X90 (DRAPE) IMPLANT
DRAPE LAPAROSCOPIC ABDOMINAL (DRAPES) IMPLANT
DRAPE U-SHAPE 76X120 STRL (DRAPES) ×4 IMPLANT
DRSG PAD ABDOMINAL 8X10 ST (GAUZE/BANDAGES/DRESSINGS) ×2 IMPLANT
DRSG TELFA 3X8 NADH (GAUZE/BANDAGES/DRESSINGS) ×2 IMPLANT
ELECT REM PT RETURN 9FT ADLT (ELECTROSURGICAL) ×2
ELECTRODE REM PT RTRN 9FT ADLT (ELECTROSURGICAL) ×1 IMPLANT
EVACUATOR SILICONE 100CC (DRAIN) IMPLANT
FILTER 7/8 IN (FILTER) ×2 IMPLANT
GAUZE SPONGE 4X4 12PLY STRL (GAUZE/BANDAGES/DRESSINGS) ×2 IMPLANT
GAUZE XEROFORM 5X9 LF (GAUZE/BANDAGES/DRESSINGS) ×2 IMPLANT
GLOVE BIO SURGEON STRL SZ 6.5 (GLOVE) ×2 IMPLANT
GLOVE BIOGEL M STRL SZ7.5 (GLOVE) ×2 IMPLANT
GLOVE BIOGEL PI IND STRL 8 (GLOVE) ×1 IMPLANT
GLOVE BIOGEL PI INDICATOR 8 (GLOVE) ×1
GLOVE ECLIPSE 7.0 STRL STRAW (GLOVE) ×2 IMPLANT
GOWN STRL REUS W/ TWL XL LVL3 (GOWN DISPOSABLE) ×2 IMPLANT
GOWN STRL REUS W/TWL XL LVL3 (GOWN DISPOSABLE) ×2
IV NS 500ML (IV SOLUTION) ×1
IV NS 500ML BAXH (IV SOLUTION) ×1 IMPLANT
NDL SAFETY ECLIPSE 18X1.5 (NEEDLE) IMPLANT
NEEDLE HYPO 18GX1.5 SHARP (NEEDLE)
NEEDLE HYPO 25X1 1.5 SAFETY (NEEDLE) IMPLANT
NEEDLE SPNL 18GX3.5 QUINCKE PK (NEEDLE) IMPLANT
NS IRRIG 1000ML POUR BTL (IV SOLUTION) ×2 IMPLANT
PACK BASIN DAY SURGERY FS (CUSTOM PROCEDURE TRAY) ×2 IMPLANT
PACK LITHOTOMY IV (CUSTOM PROCEDURE TRAY) IMPLANT
PEN SKIN MARKING BROAD TIP (MISCELLANEOUS) ×2 IMPLANT
PIN SAFETY STERILE (MISCELLANEOUS) IMPLANT
SHEET MEDIUM DRAPE 40X70 STRL (DRAPES) IMPLANT
SLEEVE SCD COMPRESS KNEE MED (MISCELLANEOUS) ×2 IMPLANT
SPONGE LAP 18X18 X RAY DECT (DISPOSABLE) ×4 IMPLANT
STAPLER VISISTAT 35W (STAPLE) IMPLANT
STOCKINETTE IMPERVIOUS LG (DRAPES) IMPLANT
STRIP SUTURE WOUND CLOSURE 1/2 (SUTURE) IMPLANT
SUT ETHILON 3 0 FSL (SUTURE) IMPLANT
SUT MNCRL AB 3-0 PS2 18 (SUTURE) ×2 IMPLANT
SUT MON AB 2-0 CT1 36 (SUTURE) ×2 IMPLANT
SUT PROLENE 4 0 P 3 18 (SUTURE) IMPLANT
SYR 20CC LL (SYRINGE) IMPLANT
SYR 50ML LL SCALE MARK (SYRINGE) ×4 IMPLANT
SYR 5ML LL (SYRINGE) IMPLANT
SYR CONTROL 10ML LL (SYRINGE) ×2 IMPLANT
TOWEL OR 17X24 6PK STRL BLUE (TOWEL DISPOSABLE) ×6 IMPLANT
TRAY DSU PREP LF (CUSTOM PROCEDURE TRAY) ×2 IMPLANT
TUBE CONNECTING 20X1/4 (TUBING) ×2 IMPLANT
UNDERPAD 30X30 INCONTINENT (UNDERPADS AND DIAPERS) ×2 IMPLANT
VAC PENCILS W/TUBING CLEAR (MISCELLANEOUS) ×2 IMPLANT
YANKAUER SUCT BULB TIP NO VENT (SUCTIONS) ×2 IMPLANT

## 2014-01-21 NOTE — Discharge Instructions (Signed)
Activity (include date of return to work if known) As tolerated: NO showers NO driving No heavy activities  Diet:regular No restrictions:  Wound Care: Keep dressing clean & dry  Do not change dressings  Special Instructions: Do not raise arms up Call Doctor if any unusual problems occur such as pain, excessive Bleeding, unrelieved Nausea/vomiting, Fever &/or chills When lying down, keep head elevated on 2-3 pillows or back-rest  Follow-up appointment: Please call the office.  The patient received discharge instruction from:___________________________________________      Patient signature ________________________________________ / Date___________    Signature of individual providing instructions/ Date________________

## 2014-01-21 NOTE — Anesthesia Preprocedure Evaluation (Signed)
Anesthesia Evaluation  Patient identified by MRN, date of birth, ID band Patient awake    Reviewed: Allergy & Precautions, H&P , NPO status , Patient's Chart, lab work & pertinent test results  Airway Mallampati: I TM Distance: >3 FB Neck ROM: Full    Dental  (+) Teeth Intact, Dental Advisory Given   Pulmonary  breath sounds clear to auscultation        Cardiovascular hypertension, Pt. on medications Rhythm:Regular Rate:Normal     Neuro/Psych    GI/Hepatic GERD-  Medicated and Controlled,  Endo/Other  Morbid obesity  Renal/GU      Musculoskeletal   Abdominal   Peds  Hematology   Anesthesia Other Findings   Reproductive/Obstetrics                           Anesthesia Physical Anesthesia Plan  ASA: II  Anesthesia Plan: General   Post-op Pain Management:    Induction: Intravenous  Airway Management Planned: Oral ETT  Additional Equipment:   Intra-op Plan:   Post-operative Plan: Extubation in OR  Informed Consent: I have reviewed the patients History and Physical, chart, labs and discussed the procedure including the risks, benefits and alternatives for the proposed anesthesia with the patient or authorized representative who has indicated his/her understanding and acceptance.   Dental advisory given  Plan Discussed with: CRNA, Anesthesiologist and Surgeon  Anesthesia Plan Comments:         Anesthesia Quick Evaluation

## 2014-01-21 NOTE — Anesthesia Postprocedure Evaluation (Signed)
  Anesthesia Post-op Note  Patient: [name]  Procedure(s) Performed: Procedure(s): EXCISION HIDRADENITIS RIGHT AXILLA (Right)  Patient Location: PACU  Anesthesia Type:General  Level of Consciousness: awake, alert  and oriented  Airway and Oxygen Therapy: Patient Spontanous Breathing  Post-op Pain: mild  Post-op Assessment: Post-op Vital signs reviewed  Post-op Vital Signs: Reviewed  Last Vitals:  Filed Vitals:   01/21/14 0915  BP: 119/71  Pulse: 92  Temp:   Resp: 9    Complications: No apparent anesthesia complications

## 2014-01-21 NOTE — Brief Op Note (Signed)
01/21/2014  8:35 AM  PATIENT:  [name]  38 y.o. female  PRE-OPERATIVE DIAGNOSIS:  HIDRADENITIS RIGHT   POST-OPERATIVE DIAGNOSIS:  HIDRADENITIS RIGHT Axilla  PROCEDURE:  Procedure(s): EXCISION HIDRADENITIS RIGHT AXILLA (Right)  SURGEON:  Surgeon(s) and Role:    * Louisa SecondGerald Suellyn Meenan, MD - Primary  PHYSICIAN ASSISTANT:   ASSISTANTS: none   ANESTHESIA:   general  EBL:  Total I/O In: 400 [I.V.:400] Out: -   BLOOD ADMINISTERED:none  DRAINS: none   LOCAL MEDICATIONS USED:  LIDOCAINE   SPECIMEN:  Excision  DISPOSITION OF SPECIMEN:  PATHOLOGY  COUNTS:  YES  TOURNIQUET:  * No tourniquets in log *  DICTATION: .Other Dictation: Dictation Number [id number]  PLAN OF CARE: Discharge to home after PACU  PATIENT DISPOSITION:  PACU - hemodynamically stable.   Delay start of Pharmacological VTE agent (>24hrs) due to surgical blood loss or risk of bleeding: yes

## 2014-01-21 NOTE — Anesthesia Procedure Notes (Signed)
Procedure Name: Intubation Performed by: Narjis Mira W Pre-anesthesia Checklist: Patient identified, Timeout performed, Emergency Drugs available, Suction available and Patient being monitored Patient Re-evaluated:Patient Re-evaluated prior to inductionOxygen Delivery Method: Circle system utilized Preoxygenation: Pre-oxygenation with 100% oxygen Intubation Type: IV induction Ventilation: Mask ventilation without difficulty Laryngoscope Size: Miller and 2 Grade View: Grade II Tube type: Oral Tube size: 7.0 mm Number of attempts: 1 Airway Equipment and Method: Stylet Placement Confirmation: ETT inserted through vocal cords under direct vision,  breath sounds checked- equal and bilateral and positive ETCO2 Secured at: 22 cm Tube secured with: Tape Dental Injury: Teeth and Oropharynx as per pre-operative assessment      

## 2014-01-21 NOTE — Transfer of Care (Signed)
Immediate Anesthesia Transfer of Care Note  Patient: [name]  Procedure(s) Performed: Procedure(s): EXCISION HIDRADENITIS RIGHT AXILLA (Right)  Patient Location: PACU  Anesthesia Type:General  Level of Consciousness: awake and sedated  Airway & Oxygen Therapy: Patient Spontanous Breathing and Patient connected to face mask oxygen  Post-op Assessment: Report given to PACU RN  Post vital signs: Reviewed and stable  Complications: No apparent anesthesia complications

## 2014-01-21 NOTE — H&P (Signed)
[name] is an 38 y.o. female.   Chief Complaint: Hidradinitis suppurativa right axilla HPI: Chronic recurrent infections not treatable with conservative treatment  Past Medical History  Diagnosis Date  . Obesity   . History of MRSA infection 09/2007    buttocks  . White coat syndrome without hypertension   . Migraine     no aura - has stroke-like symptoms with the migraines  . Anemia     no current med.  . Right axillary hidradenitis 01/2014  . Rash 01/15/2014    arm, back  . Complication of anesthesia     wakes up trying to pull wires/lead/IVs out; hard to wake up post-op  . Family history of anesthesia complication     pt's mother has hx. of being hard to wake up post-op    Past Surgical History  Procedure Laterality Date  . Axillary hidradenitis excision Left 01/26/2010  . Wisdom tooth extraction    . Cholecystectomy  1998  . Incision and drainage abscess  09/30/1999    periumbilical  . Incision and drainage abscess  03/16/2001    infraumbilical  . Incision and drainage abscess  05/07/2002    abd. wall  . Axillary hidradenitis excision Right 11/03/2009  . Upper gi endoscopy      Family History  Problem Relation Age of Onset  . Prostate cancer Paternal Grandfather   . Hypertension Mother   . Anesthesia problems Mother     hard to wake up post-op  . Thyroid disease Sister   . Hypertension Maternal Grandmother   . Diabetes Maternal Grandmother   . Heart disease Maternal Grandmother   . Diabetes Maternal Uncle   . Cancer Maternal Grandfather     ? lung   Social History:  reports that she has never smoked. She has never used smokeless tobacco. She reports that she drinks alcohol. She reports that she does not use illicit drugs.  Allergies: No Known Allergies  Medications Prior to Admission  Medication Sig Dispense Refill  . Ascorbic Acid (VITAMIN C PO) Take by mouth daily.      [name]. aspirin 81 MG tablet Take 81 mg by mouth daily.      . diphenhydrAMINE (BENADRYL)  2 % cream Apply topically 3 (three) times daily as needed for itching.      . diphenhydrAMINE (BENADRYL) 25 MG tablet Take 25 mg by mouth every 6 (six) hours as needed.      . Fish Oil OIL 5 mLs by Does not apply route daily.      . folic acid (FOLVITE) 1 MG tablet Take 1 mg by mouth daily.      . Magnesium 100 MG CAPS Take by mouth.      . Riboflavin 100 MG CAPS Take by mouth.        Results for orders placed during the hospital encounter of 01/21/14 (from the past 48 hour(s))  POCT HEMOGLOBIN-HEMACUE     Status: None   Collection Time    01/21/14  6:56 AM      Result Value Ref Range   Hemoglobin 12.7  12.0 - 15.0 g/dL   No results found.  Review of Systems  Constitutional: Negative.   HENT: Negative.   Eyes: Negative.   Respiratory: Negative.   Cardiovascular: Negative.   Gastrointestinal: Negative.   Genitourinary: Negative.   Musculoskeletal: Negative.   Skin: Positive for rash.  Neurological: Negative.   Endo/Heme/Allergies: Negative.   Psychiatric/Behavioral: Negative.     Blood pressure 163/96,  pulse 92, temperature 98.3 F (36.8 C), temperature source Oral, resp. rate 20, height 5\' 2"  (1.575 m), weight 152.125 kg (335 lb 6 oz), last menstrual period 01/02/2014, SpO2 97.00%. Physical Exam   Assessment/Plan H idraidenitis right axilla for excisuion and Estill Battenyan Pollack rotational flap coverage  Louisa SecondGerald Rhya Shan 01/21/2014, 7:27 AM

## 2014-01-22 NOTE — Op Note (Signed)
[name]:  [name], [name]              ACCOUNT NO.:  [id number]  MEDICAL RECORD NO.:  [id number]  LOCATION:                                 FACILITY:  PHYSICIAN:  Earvin HansenGerald L. Isaias Dowson, M.D.DATE OF BIRTH:  1976-07-13  DATE OF PROCEDURE:  01/21/2014 DATE OF DISCHARGE:  01/21/2014                              OPERATIVE REPORT   INDICATION:  This is a 38 year old lady with history of hidradenitis suppurativa involving the right and left axillary regions in the past, has a flare now on the right side of this; necessitated multiple I and D's to treat it.  The patient has failed conservative intensive treatment.  PROCEDURES:  Excision of the areas, closure with __________ rotational flap coverage.  ANESTHESIA:  General.  DESCRIPTION OF PROCEDURE:  Preoperatively, the patient was sat up and drawn for the excision of all hair bearing area of the right axilla. She then underwent general anesthesia, intubated orally.  Prep was done to the chest/breast, axillary regions using Hibiclens soap solution walled off with sterile towels and drapes so as to make a sterile field. A 0.25% Xylocaine with epinephrine was injected locally for vasoconstriction, a total of 150 mL of 1:400,000 concentration.  After waiting for appropriate amount of time for this, an excision was made with a 15 blade down to skin and subcutaneous tissue to the underlying superficial fascia.  Hemostasis was maintained with the Bovie anticoagulation.  Flaps were then freed up significantly, medially and laterally so as to be able to close in a rotational flap design using deep sutures of 2-0 Monocryl x2 layers, subcuticular stitch of 3-0 Monocryl.  After subdermal 3-0 Monocryl, Steri-Strips, soft dressing were applied to all the areas after closure.  She tolerated all the procedures very well, was taken to recovery in excellent condition.  The wounds were cleansed; 4x4s, ABDs, Hypafix tape was applied.     Yaakov GuthrieGerald L.  Shon Houghruesdale, M.D.     Cathie HoopsGLT/MEDQ  D:  01/21/2014  T:  01/21/2014  Job:  [id number]

## 2014-01-23 ENCOUNTER — Encounter (HOSPITAL_BASED_OUTPATIENT_CLINIC_OR_DEPARTMENT_OTHER): Payer: Self-pay | Admitting: Specialist

## 2014-04-11 ENCOUNTER — Ambulatory Visit: Payer: 59 | Admitting: Obstetrics & Gynecology

## 2014-04-15 ENCOUNTER — Ambulatory Visit (INDEPENDENT_AMBULATORY_CARE_PROVIDER_SITE_OTHER): Payer: 59 | Admitting: Obstetrics & Gynecology

## 2014-04-15 ENCOUNTER — Encounter: Payer: Self-pay | Admitting: Obstetrics & Gynecology

## 2014-04-15 VITALS — BP 120/84 | HR 64 | Resp 16 | Ht 62.5 in | Wt 333.4 lb

## 2014-04-15 DIAGNOSIS — Z01419 Encounter for gynecological examination (general) (routine) without abnormal findings: Secondary | ICD-10-CM

## 2014-04-15 DIAGNOSIS — Z124 Encounter for screening for malignant neoplasm of cervix: Secondary | ICD-10-CM
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
## 2014-04-15 NOTE — Patient Instructions (Signed)
Rich Numberebecca Erwin Wells, M.D., M.P.H. Assistant Professor, Neurology Contact Information  New Patient Appointments: [phone]  Returning Patient Appointments: [phone] Department: [phone]

## 2014-04-17 LAB — IPS PAP TEST WITH REFLEX TO HPV

## 2014-04-19 ENCOUNTER — Encounter: Payer: Self-pay | Admitting: Obstetrics & Gynecology

## 2014-04-25 ENCOUNTER — Ambulatory Visit: Payer: 59 | Admitting: Obstetrics & Gynecology

## 2014-05-18 ENCOUNTER — Encounter: Payer: Self-pay | Admitting: Obstetrics & Gynecology

## 2015-01-14 ENCOUNTER — Encounter: Payer: Self-pay | Admitting: Obstetrics & Gynecology

## 2015-01-14 ENCOUNTER — Ambulatory Visit (INDEPENDENT_AMBULATORY_CARE_PROVIDER_SITE_OTHER): Payer: BLUE CROSS/BLUE SHIELD | Admitting: Obstetrics & Gynecology

## 2015-01-14 VITALS — BP 124/84 | HR 72 | Resp 16 | Ht 62.5 in | Wt 337.4 lb

## 2015-01-14 DIAGNOSIS — Z01419 Encounter for gynecological examination (general) (routine) without abnormal findings: Secondary | ICD-10-CM | POA: Diagnosis not present

## 2015-01-14 MED ORDER — NYSTATIN 100000 UNIT/GM EX CREA: 1.0000 "application " | TOPICAL_CREAM | Freq: Two times a day (BID) | CUTANEOUS | Status: DC

## 2015-01-14 MED ORDER — NYSTATIN 100000 UNIT/GM EX POWD
Freq: Three times a day (TID) | CUTANEOUS | Status: DC
Start: 2015-01-14 — End: 2017-05-16

## 2015-01-14 NOTE — Patient Instructions (Signed)
[facility]
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
## 2015-12-22 DIAGNOSIS — H04123 Dry eye syndrome of bilateral lacrimal glands: Secondary | ICD-10-CM | POA: Diagnosis not present

## 2016-04-01 ENCOUNTER — Encounter: Payer: Self-pay | Admitting: Obstetrics & Gynecology

## 2016-04-01 ENCOUNTER — Ambulatory Visit (INDEPENDENT_AMBULATORY_CARE_PROVIDER_SITE_OTHER): Payer: BLUE CROSS/BLUE SHIELD | Admitting: Obstetrics & Gynecology

## 2016-04-01 VITALS — BP 148/80 | HR 88 | Resp 18 | Ht 62.5 in | Wt 302.0 lb

## 2016-04-01 DIAGNOSIS — Z01419 Encounter for gynecological examination (general) (routine) without abnormal findings: Secondary | ICD-10-CM | POA: Diagnosis not present

## 2016-04-01 DIAGNOSIS — Z0000 Encounter for general adult medical examination without abnormal findings: Secondary | ICD-10-CM

## 2016-04-01 DIAGNOSIS — Z124 Encounter for screening for malignant neoplasm of cervix: Secondary | ICD-10-CM

## 2016-04-01 DIAGNOSIS — Z23 Encounter for immunization: Secondary | ICD-10-CM

## 2016-04-01 NOTE — Patient Instructions (Signed)
[place]
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
## 2016-04-05 LAB — IPS PAP TEST WITH HPV

## 2016-11-13 DIAGNOSIS — L02212 Cutaneous abscess of back [any part, except buttock]: Secondary | ICD-10-CM | POA: Diagnosis not present

## 2016-11-24 DIAGNOSIS — H35033 Hypertensive retinopathy, bilateral: Secondary | ICD-10-CM | POA: Diagnosis not present

## 2016-12-28 DIAGNOSIS — M25562 Pain in left knee: Secondary | ICD-10-CM | POA: Diagnosis not present

## 2016-12-28 DIAGNOSIS — G8929 Other chronic pain: Secondary | ICD-10-CM | POA: Diagnosis not present

## 2016-12-28 DIAGNOSIS — M170 Bilateral primary osteoarthritis of knee: Secondary | ICD-10-CM | POA: Diagnosis not present

## 2017-03-08 DIAGNOSIS — Z6841 Body Mass Index (BMI) 40.0 and over, adult: Secondary | ICD-10-CM | POA: Diagnosis not present

## 2017-03-08 DIAGNOSIS — N912 Amenorrhea, unspecified: Secondary | ICD-10-CM | POA: Diagnosis not present

## 2017-03-08 DIAGNOSIS — Z0000 Encounter for general adult medical examination without abnormal findings: Secondary | ICD-10-CM | POA: Diagnosis not present

## 2017-03-08 DIAGNOSIS — D509 Iron deficiency anemia, unspecified: Secondary | ICD-10-CM | POA: Diagnosis not present

## 2017-03-08 DIAGNOSIS — E782 Mixed hyperlipidemia: Secondary | ICD-10-CM | POA: Diagnosis not present

## 2017-03-29 DIAGNOSIS — Z1231 Encounter for screening mammogram for malignant neoplasm of breast: Secondary | ICD-10-CM | POA: Diagnosis not present

## 2017-03-29 DIAGNOSIS — Z803 Family history of malignant neoplasm of breast: Secondary | ICD-10-CM | POA: Diagnosis not present

## 2017-04-22 ENCOUNTER — Encounter: Payer: Self-pay | Admitting: Hematology and Oncology

## 2017-04-22 ENCOUNTER — Telehealth: Payer: Self-pay | Admitting: Hematology and Oncology

## 2017-04-22 NOTE — Telephone Encounter (Signed)
Appt has been scheduled for the pt to see Dr. Felton Clinton on 9/10 at 2pm. Address and insurance verified. Letter mailed and faxed to the referring.

## 2017-05-15 ENCOUNTER — Encounter: Payer: Self-pay | Admitting: Hematology and Oncology

## 2017-05-15 ENCOUNTER — Encounter: Payer: Self-pay | Admitting: Obstetrics & Gynecology

## 2017-05-16 ENCOUNTER — Telehealth: Payer: Self-pay

## 2017-05-16 ENCOUNTER — Encounter: Payer: Self-pay | Admitting: Hematology and Oncology

## 2017-05-16 ENCOUNTER — Telehealth: Payer: Self-pay | Admitting: *Deleted

## 2017-05-16 ENCOUNTER — Ambulatory Visit (HOSPITAL_BASED_OUTPATIENT_CLINIC_OR_DEPARTMENT_OTHER): Payer: BLUE CROSS/BLUE SHIELD | Admitting: Hematology and Oncology

## 2017-05-16 ENCOUNTER — Ambulatory Visit (HOSPITAL_BASED_OUTPATIENT_CLINIC_OR_DEPARTMENT_OTHER): Payer: BLUE CROSS/BLUE SHIELD

## 2017-05-16 VITALS — BP 132/70 | HR 67 | Temp 98.4°F | Resp 18 | Ht 62.5 in | Wt 228.9 lb

## 2017-05-16 DIAGNOSIS — D500 Iron deficiency anemia secondary to blood loss (chronic): Secondary | ICD-10-CM | POA: Diagnosis not present

## 2017-05-16 DIAGNOSIS — D509 Iron deficiency anemia, unspecified: Secondary | ICD-10-CM

## 2017-05-16 LAB — COMPREHENSIVE METABOLIC PANEL
ALBUMIN: 3.3 g/dL — AB (ref 3.5–5.0)
ALK PHOS: 69 U/L (ref 40–150)
ALT: 13 U/L (ref 0–55)
AST: 17 U/L (ref 5–34)
Anion Gap: 7 mEq/L (ref 3–11)
BUN: 13 mg/dL (ref 7.0–26.0)
CALCIUM: 9 mg/dL (ref 8.4–10.4)
CO2: 26 mEq/L (ref 22–29)
Chloride: 110 mEq/L — ABNORMAL HIGH (ref 98–109)
Creatinine: 0.8 mg/dL (ref 0.6–1.1)
EGFR: >90 mL/min/{1.73_m2} (ref >90)
Glucose: 88 mg/dl (ref 70–140)
Potassium: 3.2 mEq/L — ABNORMAL LOW (ref 3.5–5.1)
Sodium: 142 mEq/L (ref 136–145)
Total Bilirubin: <0.22 mg/dL (ref 0.20–1.20)
Total Protein: 7 g/dL (ref 6.4–8.3)

## 2017-05-16 LAB — CBC & DIFF AND RETIC
BASO%: 0.5 % (ref 0.0–2.0)
Basophils Absolute: 0 10*3/uL (ref 0.0–0.1)
EOS ABS: 0.1 10*3/uL (ref 0.0–0.5)
EOS%: 1.9 % (ref 0.0–7.0)
HCT: 37.7 % (ref 34.8–46.6)
HEMOGLOBIN: 12.2 g/dL (ref 11.6–15.9)
Immature Retic Fract: 2.8 % (ref 1.60–10.00)
LYMPH%: 44.8 % (ref 14.0–49.7)
MCH: 28.8 pg (ref 25.1–34.0)
MCHC: 32.4 g/dL (ref 31.5–36.0)
MCV: 89.1 fL (ref 79.5–101.0)
MONO#: 0.4 10*3/uL (ref 0.1–0.9)
MONO%: 6.5 % (ref 0.0–14.0)
NEUT#: 2.7 10*3/uL (ref 1.5–6.5)
NEUT%: 46.3 % (ref 38.4–76.8)
Platelets: 328 10*3/uL (ref 145–400)
RBC: 4.23 10*6/uL (ref 3.70–5.45)
RDW: 14.3 % (ref 11.2–14.5)
RETIC %: 0.89 % (ref 0.70–2.10)
Retic Ct Abs: 37.65 10*3/uL (ref 33.70–90.70)
WBC: 5.9 10*3/uL (ref 3.9–10.3)
lymph#: 2.6 10*3/uL (ref 0.9–3.3)

## 2017-05-16 NOTE — Telephone Encounter (Signed)
Printed avs and calender for 9/18 per los

## 2017-05-16 NOTE — Telephone Encounter (Signed)
Copy of labs placed on Dr. Rondel BatonMiller's desk for review. Next AEX 07/01/17. See MyChart message to patient.    From Osie Paget To Jerene BearsMary S Miller, MD Sent 05/15/2017 5:49 PM  See attached for my lab results from my 03/08/17 Wellness Visit with Dr[facility] Shirlean [facility]ylararol [facility] [place].

## 2017-05-17 LAB — IRON AND TIBC
%SAT: 12 % — ABNORMAL LOW (ref 21–57)
Iron: 36 ug/dL — ABNORMAL LOW (ref 41–142)
TIBC: 306 ug/dL (ref 236–444)
UIBC: 270 ug/dL (ref 120–384)

## 2017-05-17 LAB — FERRITIN: FERRITIN: 52 ng/mL (ref 9–269)

## 2017-05-19 DIAGNOSIS — D649 Anemia, unspecified: Secondary | ICD-10-CM | POA: Insufficient documentation

## 2017-05-19 DIAGNOSIS — D500 Iron deficiency anemia secondary to blood loss (chronic): Secondary | ICD-10-CM | POA: Insufficient documentation
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
## 2017-05-19 NOTE — Assessment & Plan Note (Signed)
41 year old female with previous history of iron deficiency anemia treated in 1990s with oral iron with her treatment complicated by nausea. Patient referred to our clinic for possible recurrence of the iron deficiency anemia based on the limited lab work loading normal hemoglobin level and dropping level of ferritin family interpreting the available information correctly.  Most recent available lab work is 2 months old and repeating labs would be the most logical step at this time to assess where patient is in terms of her hemoglobin and iron storage.  Patient does have heavy menstrual periods and would benefit from evaluation by gynecology. I will defer referral until hematological profile is reassessed.  Plan: --Repeat labwork today --Return to clinic next week to review labs and possibly initiate iron supplementation. We will reattempt oral iron supplementation with liquids or gummy form of the iron as a first line of treatment prior to resorting to parenteral iron supplementation

## 2017-05-24 ENCOUNTER — Ambulatory Visit (HOSPITAL_BASED_OUTPATIENT_CLINIC_OR_DEPARTMENT_OTHER): Payer: BLUE CROSS/BLUE SHIELD | Admitting: Hematology and Oncology

## 2017-05-24 ENCOUNTER — Telehealth: Payer: Self-pay | Admitting: Hematology and Oncology

## 2017-05-24 ENCOUNTER — Encounter: Payer: Self-pay | Admitting: Hematology and Oncology

## 2017-05-24 VITALS — BP 123/72 | HR 78 | Temp 97.8°F | Resp 18 | Wt 235.5 lb

## 2017-05-24 DIAGNOSIS — Z862 Personal history of diseases of the blood and blood-forming organs and certain disorders involving the immune mechanism: Secondary | ICD-10-CM

## 2017-05-24 DIAGNOSIS — D500 Iron deficiency anemia secondary to blood loss (chronic): Secondary | ICD-10-CM

## 2017-05-24 NOTE — Telephone Encounter (Signed)
Scheduled appt per 9/18 los - Gave patient AVS and calender per los.  

## 2017-05-25 ENCOUNTER — Encounter: Payer: Self-pay | Admitting: Obstetrics & Gynecology

## 2017-05-29 NOTE — Assessment & Plan Note (Signed)
41 y.o. female with previous history of iron deficiency anemia treated in 1990s with oral iron with her treatment complicated by nausea. Patient referred to our clinic for possible recurrence of the iron deficiency anemia. Patient does have heavy menstrual periods and would benefit from evaluation by gynecology.   Repeat blood work obtained in our clinic demonstrated no evidence of current anemia. Patient's iron stores appeared to be adequate this time and do not necessarily warrant aggressive replacement therapy, especially there is no indication for parenteral iron administration at this time. Patient could consider low dose of iron maintenance therapy by mouth. She does have difficulty swallowing pills and the liquid replacement will be considered in this instance with ferrous sulfate liquid form.  Plan: --No need for iron replacement at this time --RTC 3 months: labs, clinic visit for hematological monitoring. If iron levels are dropping, we will consider starting oral iron therapy with ferrous sulfate liquid
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
## 2017-07-01 ENCOUNTER — Encounter: Payer: Self-pay | Admitting: Obstetrics & Gynecology

## 2017-07-01 ENCOUNTER — Ambulatory Visit (INDEPENDENT_AMBULATORY_CARE_PROVIDER_SITE_OTHER): Payer: BLUE CROSS/BLUE SHIELD | Admitting: Obstetrics & Gynecology

## 2017-07-01 VITALS — BP 110/72 | HR 80 | Resp 16 | Ht 63.0 in | Wt 222.0 lb

## 2017-07-01 DIAGNOSIS — Z01419 Encounter for gynecological examination (general) (routine) without abnormal findings: Secondary | ICD-10-CM | POA: Diagnosis not present

## 2017-08-08 ENCOUNTER — Other Ambulatory Visit (HOSPITAL_BASED_OUTPATIENT_CLINIC_OR_DEPARTMENT_OTHER): Payer: BLUE CROSS/BLUE SHIELD

## 2017-08-08 DIAGNOSIS — D500 Iron deficiency anemia secondary to blood loss (chronic): Secondary | ICD-10-CM

## 2017-08-08 DIAGNOSIS — Z862 Personal history of diseases of the blood and blood-forming organs and certain disorders involving the immune mechanism: Secondary | ICD-10-CM | POA: Diagnosis not present

## 2017-08-08 LAB — FERRITIN: FERRITIN: 29 ng/mL (ref 9–269)

## 2017-08-08 LAB — COMPREHENSIVE METABOLIC PANEL
ALT: 15 U/L (ref 0–55)
ANION GAP: 6 meq/L (ref 3–11)
AST: 18 U/L (ref 5–34)
Albumin: 3.3 g/dL — ABNORMAL LOW (ref 3.5–5.0)
Alkaline Phosphatase: 53 U/L (ref 40–150)
BILIRUBIN TOTAL: 0.27 mg/dL (ref 0.20–1.20)
BUN: 9.9 mg/dL (ref 7.0–26.0)
CO2: 25 meq/L (ref 22–29)
CREATININE: 0.8 mg/dL (ref 0.6–1.1)
Calcium: 8.5 mg/dL (ref 8.4–10.4)
Chloride: 111 mEq/L — ABNORMAL HIGH (ref 98–109)
EGFR: >60 mL/min/{1.73_m2} (ref >60)
GLUCOSE: 87 mg/dL (ref 70–140)
Potassium: 4.3 mEq/L (ref 3.5–5.1)
Sodium: 142 mEq/L (ref 136–145)
TOTAL PROTEIN: 6.4 g/dL (ref 6.4–8.3)

## 2017-08-08 LAB — CBC WITH DIFFERENTIAL/PLATELET
BASO%: 1.3 % (ref 0.0–2.0)
BASOS ABS: 0.1 10*3/uL (ref 0.0–0.1)
EOS%: 3.6 % (ref 0.0–7.0)
Eosinophils Absolute: 0.1 10*3/uL (ref 0.0–0.5)
HCT: 36.9 % (ref 34.8–46.6)
HGB: 11.9 g/dL (ref 11.6–15.9)
LYMPH%: 44.7 % (ref 14.0–49.7)
MCH: 29.2 pg (ref 25.1–34.0)
MCHC: 32.2 g/dL (ref 31.5–36.0)
MCV: 90.8 fL (ref 79.5–101.0)
MONO#: 0.4 10*3/uL (ref 0.1–0.9)
MONO%: 9.8 % (ref 0.0–14.0)
NEUT#: 1.7 10*3/uL (ref 1.5–6.5)
NEUT%: 40.6 % (ref 38.4–76.8)
Platelets: 243 10*3/uL (ref 145–400)
RBC: 4.06 10*6/uL (ref 3.70–5.45)
RDW: 13.9 % (ref 11.2–14.5)
WBC: 4.1 10*3/uL (ref 3.9–10.3)
lymph#: 1.8 10*3/uL (ref 0.9–3.3)

## 2017-08-08 LAB — IRON AND TIBC
%SAT: 14 % — AB (ref 21–57)
Iron: 44 ug/dL (ref 41–142)
TIBC: 308 ug/dL (ref 236–444)
UIBC: 264 ug/dL (ref 120–384)

## 2017-08-11 ENCOUNTER — Telehealth: Payer: Self-pay | Admitting: Hematology and Oncology

## 2017-08-11 ENCOUNTER — Encounter: Payer: Self-pay | Admitting: Hematology and Oncology

## 2017-08-11 ENCOUNTER — Ambulatory Visit (HOSPITAL_BASED_OUTPATIENT_CLINIC_OR_DEPARTMENT_OTHER): Payer: BLUE CROSS/BLUE SHIELD | Admitting: Hematology and Oncology

## 2017-08-11 VITALS — BP 138/70 | HR 79 | Temp 98.3°F | Resp 18 | Ht 63.0 in | Wt 229.4 lb

## 2017-08-11 DIAGNOSIS — D500 Iron deficiency anemia secondary to blood loss (chronic): Secondary | ICD-10-CM

## 2017-08-11 DIAGNOSIS — D509 Iron deficiency anemia, unspecified: Secondary | ICD-10-CM

## 2017-08-11 NOTE — Telephone Encounter (Signed)
Scheduled appt per 12/6 los - Gave patient AVS and calender per los 
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
## 2017-09-11 NOTE — Assessment & Plan Note (Signed)
42 y.o. female with previous history of iron deficiency anemia treated in 1990s with oral iron with her treatment complicated by nausea. Patient referred to our clinic for possible recurrence of the iron deficiency anemia. Patient does have heavy menstrual periods and would benefit from evaluation by gynecology.   Repeat blood work since last visit to the clinic demonstrates mild decrease in hemoglobin and moderate drop in ferritin value consistent with progressive iron deficit.   Plan: --Start oral ferrous sulfate liquid or GYN supplementation as the patient has trouble tolerating the pills goal is to administer 65-130 mg of elemental iron per day. --Return to clinic in 6 weeks with lab work 2 days prior to assess efficiency of iron replacement.

## 2017-09-17 ENCOUNTER — Encounter (INDEPENDENT_AMBULATORY_CARE_PROVIDER_SITE_OTHER): Payer: Self-pay

## 2017-09-19 ENCOUNTER — Inpatient Hospital Stay: Payer: BLUE CROSS/BLUE SHIELD | Attending: Hematology and Oncology

## 2017-09-19 DIAGNOSIS — Z79899 Other long term (current) drug therapy: Secondary | ICD-10-CM | POA: Diagnosis not present

## 2017-09-19 DIAGNOSIS — N920 Excessive and frequent menstruation with regular cycle: Secondary | ICD-10-CM | POA: Insufficient documentation

## 2017-09-19 DIAGNOSIS — E669 Obesity, unspecified: Secondary | ICD-10-CM | POA: Diagnosis not present

## 2017-09-19 DIAGNOSIS — Z8614 Personal history of Methicillin resistant Staphylococcus aureus infection: Secondary | ICD-10-CM | POA: Diagnosis not present

## 2017-09-19 DIAGNOSIS — D500 Iron deficiency anemia secondary to blood loss (chronic): Secondary | ICD-10-CM | POA: Diagnosis not present

## 2017-09-19 LAB — CBC WITH DIFFERENTIAL (CANCER CENTER ONLY)
Basophils Absolute: 0 10*3/uL (ref 0.0–0.1)
Basophils Relative: 0 %
EOS ABS: 0.2 10*3/uL (ref 0.0–0.5)
Eosinophils Relative: 3 %
HCT: 39.7 % (ref 34.8–46.6)
HEMOGLOBIN: 12.7 g/dL (ref 11.6–15.9)
LYMPHS ABS: 2.1 10*3/uL (ref 0.9–3.3)
Lymphocytes Relative: 43 %
MCH: 29.5 pg (ref 25.1–34.0)
MCHC: 32 g/dL (ref 31.5–36.0)
MCV: 92.1 fL (ref 79.5–101.0)
Monocytes Absolute: 0.4 10*3/uL (ref 0.1–0.9)
Monocytes Relative: 9 %
NEUTROS PCT: 45 %
Neutro Abs: 2.2 10*3/uL (ref 1.5–6.5)
Platelet Count: 277 10*3/uL (ref 145–400)
RBC: 4.31 MIL/uL (ref 3.70–5.45)
RDW: 13.5 % (ref 11.2–16.1)
WBC: 4.9 10*3/uL (ref 3.9–10.3)

## 2017-09-19 LAB — COMPREHENSIVE METABOLIC PANEL
ALBUMIN: 3.4 g/dL — AB (ref 3.5–5.0)
ALK PHOS: 60 U/L (ref 40–150)
ALT: 15 U/L (ref 0–55)
AST: 18 U/L (ref 5–34)
Anion gap: 7 (ref 3–11)
BILIRUBIN TOTAL: 0.3 mg/dL (ref 0.2–1.2)
BUN: 10 mg/dL (ref 7–26)
CO2: 24 mmol/L (ref 22–29)
Calcium: 8.5 mg/dL (ref 8.4–10.4)
Chloride: 109 mmol/L (ref 98–109)
Creatinine, Ser: 0.78 mg/dL (ref 0.60–1.10)
GFR calc Af Amer: >60 mL/min (ref >60)
GFR calc non Af Amer: >60 mL/min (ref >60)
GLUCOSE: 77 mg/dL (ref 70–140)
POTASSIUM: 3.9 mmol/L (ref 3.3–4.7)
Sodium: 140 mmol/L (ref 136–145)
TOTAL PROTEIN: 7 g/dL (ref 6.4–8.3)

## 2017-09-19 LAB — LACTATE DEHYDROGENASE: LDH: 167 U/L (ref 125–245)

## 2017-09-19 LAB — IRON AND TIBC
Iron: 34 ug/dL — ABNORMAL LOW (ref 41–142)
Saturation Ratios: 11 % — ABNORMAL LOW (ref 21–57)
TIBC: 305 ug/dL (ref 236–444)
UIBC: 271 ug/dL

## 2017-09-19 LAB — RETICULOCYTES
RBC.: 4.31 MIL/uL (ref 3.70–5.45)
RETIC COUNT ABSOLUTE: 38.8 10*3/uL (ref 33.7–90.7)
RETIC CT PCT: 0.9 % (ref 0.7–2.1)

## 2017-09-19 LAB — FERRITIN: Ferritin: 46 ng/mL (ref 9–269)

## 2017-09-22 ENCOUNTER — Telehealth: Payer: Self-pay | Admitting: Hematology and Oncology

## 2017-09-22 ENCOUNTER — Inpatient Hospital Stay (HOSPITAL_BASED_OUTPATIENT_CLINIC_OR_DEPARTMENT_OTHER): Payer: BLUE CROSS/BLUE SHIELD | Admitting: Hematology and Oncology

## 2017-09-22 VITALS — BP 133/88 | HR 76 | Temp 97.7°F | Resp 18 | Wt 228.3 lb

## 2017-09-22 DIAGNOSIS — Z79899 Other long term (current) drug therapy: Secondary | ICD-10-CM

## 2017-09-22 DIAGNOSIS — D500 Iron deficiency anemia secondary to blood loss (chronic): Secondary | ICD-10-CM | POA: Diagnosis not present

## 2017-09-22 DIAGNOSIS — E669 Obesity, unspecified: Secondary | ICD-10-CM | POA: Diagnosis not present

## 2017-09-22 DIAGNOSIS — N920 Excessive and frequent menstruation with regular cycle: Secondary | ICD-10-CM | POA: Diagnosis not present

## 2017-09-22 DIAGNOSIS — Z8614 Personal history of Methicillin resistant Staphylococcus aureus infection: Secondary | ICD-10-CM | POA: Diagnosis not present

## 2017-09-22 NOTE — Telephone Encounter (Signed)
Gave avs and calendar for april °

## 2017-10-10 ENCOUNTER — Telehealth: Payer: Self-pay | Admitting: Hematology and Oncology

## 2017-10-10 ENCOUNTER — Encounter (INDEPENDENT_AMBULATORY_CARE_PROVIDER_SITE_OTHER): Payer: Self-pay

## 2017-10-10 NOTE — Telephone Encounter (Signed)
Patient called to reschedule  °

## 2017-10-11 ENCOUNTER — Encounter: Payer: Self-pay | Admitting: Hematology and Oncology
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
## 2017-10-11 NOTE — Assessment & Plan Note (Signed)
42 y.o. female with previous history of iron deficiency anemia treated in 1990s with oral iron with her treatment complicated by nausea. Patient referred to our clinic for possible recurrence of the iron deficiency anemia. Patient does have heavy menstrual periods and would benefit from evaluation by gynecology.   Patient is presently receiving oral iron replacement therapy with improving hemoglobin count and ferritin levels.  Symptomatically, she is improving as well.   Plan: --Continue current oral iron supplement increase supplementation to 4 tablespoons/day if tolerated. --Return to clinic in 3 months with labs obtained 2-3 days prior to the appointment for continued hematological monitoring.

## 2017-11-28 DIAGNOSIS — H35033 Hypertensive retinopathy, bilateral: Secondary | ICD-10-CM | POA: Diagnosis not present

## 2017-12-19 ENCOUNTER — Other Ambulatory Visit: Payer: BLUE CROSS/BLUE SHIELD

## 2017-12-21 ENCOUNTER — Ambulatory Visit: Payer: BLUE CROSS/BLUE SHIELD | Admitting: Hematology and Oncology

## 2017-12-26 ENCOUNTER — Inpatient Hospital Stay: Payer: BLUE CROSS/BLUE SHIELD | Attending: Hematology and Oncology

## 2017-12-26 DIAGNOSIS — N920 Excessive and frequent menstruation with regular cycle: Secondary | ICD-10-CM | POA: Diagnosis not present

## 2017-12-26 DIAGNOSIS — D500 Iron deficiency anemia secondary to blood loss (chronic): Secondary | ICD-10-CM | POA: Diagnosis not present

## 2017-12-26 DIAGNOSIS — Z79899 Other long term (current) drug therapy: Secondary | ICD-10-CM | POA: Insufficient documentation

## 2017-12-26 LAB — CBC WITH DIFFERENTIAL (CANCER CENTER ONLY)
Basophils Absolute: 0 10*3/uL (ref 0.0–0.1)
Basophils Relative: 1 %
EOS ABS: 0.1 10*3/uL (ref 0.0–0.5)
Eosinophils Relative: 2 %
HEMATOCRIT: 34.4 % — AB (ref 34.8–46.6)
HEMOGLOBIN: 11.2 g/dL — AB (ref 11.6–15.9)
LYMPHS ABS: 2.5 10*3/uL (ref 0.9–3.3)
LYMPHS PCT: 42 %
MCH: 29.9 pg (ref 25.1–34.0)
MCHC: 32.6 g/dL (ref 31.5–36.0)
MCV: 91.7 fL (ref 79.5–101.0)
MONOS PCT: 7 %
Monocytes Absolute: 0.4 10*3/uL (ref 0.1–0.9)
NEUTROS PCT: 48 %
Neutro Abs: 2.9 10*3/uL (ref 1.5–6.5)
Platelet Count: 302 10*3/uL (ref 145–400)
RBC: 3.75 MIL/uL (ref 3.70–5.45)
RDW: 14 % (ref 11.2–14.5)
WBC: 6 10*3/uL (ref 3.9–10.3)

## 2017-12-26 LAB — CMP (CANCER CENTER ONLY)
ALK PHOS: 49 U/L (ref 40–150)
ALT: 12 U/L (ref 0–55)
ANION GAP: 8 (ref 3–11)
AST: 19 U/L (ref 5–34)
Albumin: 3.3 g/dL — ABNORMAL LOW (ref 3.5–5.0)
BILIRUBIN TOTAL: 0.3 mg/dL (ref 0.2–1.2)
BUN: 7 mg/dL (ref 7–26)
CALCIUM: 8.5 mg/dL (ref 8.4–10.4)
CO2: 22 mmol/L (ref 22–29)
Chloride: 109 mmol/L (ref 98–109)
Creatinine: 0.72 mg/dL (ref 0.60–1.10)
Glucose, Bld: 86 mg/dL (ref 70–140)
Potassium: 3.7 mmol/L (ref 3.5–5.1)
Sodium: 139 mmol/L (ref 136–145)
TOTAL PROTEIN: 6.2 g/dL — AB (ref 6.4–8.3)

## 2017-12-26 LAB — IRON AND TIBC
Iron: 113 ug/dL (ref 41–142)
Saturation Ratios: 38 % (ref 21–57)
TIBC: 295 ug/dL (ref 236–444)
UIBC: 182 ug/dL

## 2017-12-26 LAB — FERRITIN: FERRITIN: 29 ng/mL (ref 9–269)

## 2017-12-29 ENCOUNTER — Inpatient Hospital Stay (HOSPITAL_BASED_OUTPATIENT_CLINIC_OR_DEPARTMENT_OTHER): Payer: BLUE CROSS/BLUE SHIELD | Admitting: Hematology and Oncology

## 2017-12-29 ENCOUNTER — Telehealth: Payer: Self-pay | Admitting: Hematology and Oncology

## 2017-12-29 ENCOUNTER — Encounter: Payer: Self-pay | Admitting: Hematology and Oncology

## 2017-12-29 VITALS — BP 138/67 | HR 65 | Temp 98.8°F | Resp 20 | Ht 63.0 in | Wt 218.7 lb

## 2017-12-29 DIAGNOSIS — D500 Iron deficiency anemia secondary to blood loss (chronic): Secondary | ICD-10-CM | POA: Diagnosis not present

## 2017-12-29 DIAGNOSIS — N920 Excessive and frequent menstruation with regular cycle: Secondary | ICD-10-CM | POA: Diagnosis not present

## 2017-12-29 DIAGNOSIS — Z79899 Other long term (current) drug therapy: Secondary | ICD-10-CM

## 2017-12-29 NOTE — Telephone Encounter (Signed)
Appointments scheduled AVS/Calendar printed per 4/25 los °
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
## 2018-01-19 NOTE — Assessment & Plan Note (Signed)
42 y.o. female with previous history of iron deficiency anemia treated in 1990s with oral iron with her treatment complicated by nausea. Patient referred to our clinic for possible recurrence of the iron deficiency anemia. Patient does have heavy menstrual periods and would benefit from evaluation by gynecology.   Has been a slight drop in hemoglobin and ferritin since last visit to our clinic most likely due to recent Inpatient taking her iron supplement and menstrual period.  Plan: --Continue current oral iron supplement at 4 tablespoons/day if tolerated. --Return to clinic in 1 months with labs obtained 2-3 days prior to the appointment for continued hematological monitoring.

## 2018-01-24 ENCOUNTER — Inpatient Hospital Stay: Payer: BLUE CROSS/BLUE SHIELD | Attending: Hematology and Oncology

## 2018-01-24 DIAGNOSIS — D500 Iron deficiency anemia secondary to blood loss (chronic): Secondary | ICD-10-CM | POA: Insufficient documentation

## 2018-01-24 DIAGNOSIS — N920 Excessive and frequent menstruation with regular cycle: Secondary | ICD-10-CM | POA: Diagnosis not present

## 2018-01-24 LAB — IRON AND TIBC
Iron: 53 ug/dL (ref 41–142)
SATURATION RATIOS: 16 % — AB (ref 21–57)
TIBC: 335 ug/dL (ref 236–444)
UIBC: 282 ug/dL

## 2018-01-24 LAB — CMP (CANCER CENTER ONLY)
ALT: 12 U/L (ref 0–55)
AST: 19 U/L (ref 5–34)
Albumin: 3.7 g/dL (ref 3.5–5.0)
Alkaline Phosphatase: 53 U/L (ref 40–150)
Anion gap: 8 (ref 3–11)
BUN: 9 mg/dL (ref 7–26)
CO2: 21 mmol/L — ABNORMAL LOW (ref 22–29)
Calcium: 8.7 mg/dL (ref 8.4–10.4)
Chloride: 109 mmol/L (ref 98–109)
Creatinine: 0.81 mg/dL (ref 0.60–1.10)
GFR, Est AFR Am: >60 mL/min
GFR, Estimated: >60 mL/min
Glucose, Bld: 88 mg/dL (ref 70–140)
Potassium: 3.9 mmol/L (ref 3.5–5.1)
Sodium: 138 mmol/L (ref 136–145)
Total Bilirubin: 0.3 mg/dL (ref 0.2–1.2)
Total Protein: 6.8 g/dL (ref 6.4–8.3)

## 2018-01-24 LAB — CBC WITH DIFFERENTIAL (CANCER CENTER ONLY)
Basophils Absolute: 0.1 10*3/uL (ref 0.0–0.1)
Basophils Relative: 1 %
EOS PCT: 3 %
Eosinophils Absolute: 0.1 10*3/uL (ref 0.0–0.5)
HCT: 34.9 % (ref 34.8–46.6)
Hemoglobin: 11.4 g/dL — ABNORMAL LOW (ref 11.6–15.9)
LYMPHS ABS: 2.2 10*3/uL (ref 0.9–3.3)
LYMPHS PCT: 50 %
MCH: 30.1 pg (ref 25.1–34.0)
MCHC: 32.8 g/dL (ref 31.5–36.0)
MCV: 91.6 fL (ref 79.5–101.0)
MONO ABS: 0.3 10*3/uL (ref 0.1–0.9)
Monocytes Relative: 7 %
Neutro Abs: 1.8 10*3/uL (ref 1.5–6.5)
Neutrophils Relative %: 39 %
PLATELETS: 293 10*3/uL (ref 145–400)
RBC: 3.81 MIL/uL (ref 3.70–5.45)
RDW: 14 % (ref 11.2–14.5)
WBC Count: 4.5 10*3/uL (ref 3.9–10.3)

## 2018-01-24 LAB — FERRITIN: Ferritin: 30 ng/mL (ref 9–269)

## 2018-01-27 ENCOUNTER — Telehealth: Payer: Self-pay | Admitting: Hematology and Oncology

## 2018-01-27 ENCOUNTER — Inpatient Hospital Stay (HOSPITAL_BASED_OUTPATIENT_CLINIC_OR_DEPARTMENT_OTHER): Payer: BLUE CROSS/BLUE SHIELD | Admitting: Hematology and Oncology

## 2018-01-27 ENCOUNTER — Encounter: Payer: Self-pay | Admitting: Hematology and Oncology

## 2018-01-27 VITALS — BP 142/54 | HR 72 | Temp 99.0°F | Resp 18 | Ht 63.0 in | Wt 209.1 lb

## 2018-01-27 DIAGNOSIS — N920 Excessive and frequent menstruation with regular cycle: Secondary | ICD-10-CM | POA: Diagnosis not present

## 2018-01-27 DIAGNOSIS — D500 Iron deficiency anemia secondary to blood loss (chronic): Secondary | ICD-10-CM | POA: Diagnosis not present

## 2018-01-27 NOTE — Telephone Encounter (Signed)
Scheduled appt per 5/24 los - gave patient  avs and calender per los. scheduled iv iron on 6/24 per patient preference.
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
## 2018-02-06 NOTE — Assessment & Plan Note (Signed)
42 y.o. female with previous history of iron deficiency anemia treated in 1990s with oral iron with her treatment complicated by nausea. Patient referred to our clinic for possible recurrence of the iron deficiency anemia. Patient does have heavy menstrual periods.  Ferritin and hemoglobin appear to be stable overall at this time, but there has been a drop in the iron saturation levels suggesting possible worsening of iron deficiency.  Plan: --Continue current oral iron supplement at 4 tablespoons/day - Feraheme 510 mg IV x1 with goals to reach ferritin over 50 --Return to clinic in 3 months with labs obtained 2-3 days prior to the appointment for continued hematological monitoring.

## 2018-02-27 ENCOUNTER — Inpatient Hospital Stay: Payer: BLUE CROSS/BLUE SHIELD | Attending: Hematology and Oncology

## 2018-02-27 VITALS — BP 138/77 | HR 70 | Temp 98.5°F | Resp 17

## 2018-02-27 DIAGNOSIS — D500 Iron deficiency anemia secondary to blood loss (chronic): Secondary | ICD-10-CM | POA: Diagnosis not present

## 2018-02-27 DIAGNOSIS — N920 Excessive and frequent menstruation with regular cycle: Secondary | ICD-10-CM | POA: Insufficient documentation

## 2018-02-27 MED ORDER — FERUMOXYTOL INJECTION 510 MG/17 ML
510.0000 mg | Freq: Once | INTRAVENOUS | Status: AC
  Administered 2018-02-27: 510 mg via INTRAVENOUS
  Filled 2018-02-27: qty 17

## 2018-02-27 MED ORDER — SODIUM CHLORIDE 0.9 % IV SOLN
Freq: Once | INTRAVENOUS | Status: AC
  Administered 2018-02-27: 10:00:00 via INTRAVENOUS

## 2018-02-27 NOTE — Patient Instructions (Signed)

## 2018-03-31 DIAGNOSIS — Z1231 Encounter for screening mammogram for malignant neoplasm of breast: Secondary | ICD-10-CM | POA: Diagnosis not present

## 2018-04-26 ENCOUNTER — Inpatient Hospital Stay: Payer: BLUE CROSS/BLUE SHIELD | Attending: Hematology and Oncology

## 2018-04-26 DIAGNOSIS — D500 Iron deficiency anemia secondary to blood loss (chronic): Secondary | ICD-10-CM

## 2018-04-26 DIAGNOSIS — E669 Obesity, unspecified: Secondary | ICD-10-CM | POA: Insufficient documentation

## 2018-04-26 DIAGNOSIS — Z9049 Acquired absence of other specified parts of digestive tract: Secondary | ICD-10-CM | POA: Insufficient documentation

## 2018-04-26 DIAGNOSIS — N920 Excessive and frequent menstruation with regular cycle: Secondary | ICD-10-CM | POA: Insufficient documentation

## 2018-04-26 DIAGNOSIS — D508 Other iron deficiency anemias: Secondary | ICD-10-CM | POA: Diagnosis not present

## 2018-04-26 LAB — FERRITIN: Ferritin: 68 ng/mL (ref 11–307)

## 2018-04-26 LAB — CBC WITH DIFFERENTIAL (CANCER CENTER ONLY)
Basophils Absolute: 0 10*3/uL (ref 0.0–0.1)
Basophils Relative: 1 %
EOS PCT: 4 %
Eosinophils Absolute: 0.2 10*3/uL (ref 0.0–0.5)
HCT: 37 % (ref 34.8–46.6)
Hemoglobin: 11.9 g/dL (ref 11.6–15.9)
LYMPHS ABS: 1.7 10*3/uL (ref 0.9–3.3)
LYMPHS PCT: 40 %
MCH: 29.4 pg (ref 25.1–34.0)
MCHC: 32.2 g/dL (ref 31.5–36.0)
MCV: 91.4 fL (ref 79.5–101.0)
MONO ABS: 0.5 10*3/uL (ref 0.1–0.9)
Monocytes Relative: 11 %
Neutro Abs: 1.8 10*3/uL (ref 1.5–6.5)
Neutrophils Relative %: 44 %
PLATELETS: 242 10*3/uL (ref 145–400)
RBC: 4.05 MIL/uL (ref 3.70–5.45)
RDW: 14.2 % (ref 11.2–14.5)
WBC Count: 4.1 10*3/uL (ref 3.9–10.3)

## 2018-04-26 LAB — CMP (CANCER CENTER ONLY)
ALBUMIN: 3.2 g/dL — AB (ref 3.5–5.0)
ALT: 11 U/L (ref 0–44)
ANION GAP: 6 (ref 5–15)
AST: 15 U/L (ref 15–41)
Alkaline Phosphatase: 51 U/L (ref 38–126)
BUN: 7 mg/dL (ref 6–20)
CHLORIDE: 110 mmol/L (ref 98–111)
CO2: 24 mmol/L (ref 22–32)
Calcium: 8 mg/dL — ABNORMAL LOW (ref 8.9–10.3)
Creatinine: 0.74 mg/dL (ref 0.44–1.00)
GFR, Est AFR Am: >60 mL/min (ref >60)
GFR, Estimated: >60 mL/min (ref >60)
Glucose, Bld: 90 mg/dL (ref 70–99)
Potassium: 4.2 mmol/L (ref 3.5–5.1)
Sodium: 140 mmol/L (ref 135–145)
Total Bilirubin: <0.2 mg/dL — ABNORMAL LOW (ref 0.3–1.2)
Total Protein: 6.3 g/dL — ABNORMAL LOW (ref 6.5–8.1)

## 2018-04-26 LAB — IRON AND TIBC
Iron: 37 ug/dL — ABNORMAL LOW (ref 41–142)
Saturation Ratios: 14 % — ABNORMAL LOW (ref 21–57)
TIBC: 271 ug/dL (ref 236–444)
UIBC: 234 ug/dL
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
## 2018-04-28 ENCOUNTER — Telehealth: Payer: Self-pay | Admitting: Hematology

## 2018-04-28 ENCOUNTER — Inpatient Hospital Stay (HOSPITAL_BASED_OUTPATIENT_CLINIC_OR_DEPARTMENT_OTHER): Payer: BLUE CROSS/BLUE SHIELD | Admitting: Hematology

## 2018-04-28 ENCOUNTER — Encounter: Payer: Self-pay | Admitting: Hematology

## 2018-04-28 ENCOUNTER — Inpatient Hospital Stay: Payer: BLUE CROSS/BLUE SHIELD

## 2018-04-28 VITALS — BP 137/74 | HR 67 | Temp 98.9°F | Resp 17 | Ht 63.0 in | Wt 214.3 lb

## 2018-04-28 VITALS — BP 114/79 | HR 70

## 2018-04-28 DIAGNOSIS — E669 Obesity, unspecified: Secondary | ICD-10-CM | POA: Diagnosis not present

## 2018-04-28 DIAGNOSIS — N920 Excessive and frequent menstruation with regular cycle: Secondary | ICD-10-CM

## 2018-04-28 DIAGNOSIS — Z9049 Acquired absence of other specified parts of digestive tract: Secondary | ICD-10-CM

## 2018-04-28 DIAGNOSIS — D508 Other iron deficiency anemias: Secondary | ICD-10-CM | POA: Diagnosis not present

## 2018-04-28 DIAGNOSIS — D500 Iron deficiency anemia secondary to blood loss (chronic): Secondary | ICD-10-CM

## 2018-04-28 MED ORDER — SODIUM CHLORIDE 0.9 % IV SOLN
Freq: Once | INTRAVENOUS | Status: AC
  Administered 2018-04-28: 16:00:00 via INTRAVENOUS
  Filled 2018-04-28: qty 250

## 2018-04-28 MED ORDER — SODIUM CHLORIDE 0.9 % IV SOLN
510.0000 mg | Freq: Once | INTRAVENOUS | Status: AC
  Administered 2018-04-28: 510 mg via INTRAVENOUS
  Filled 2018-04-28: qty 17

## 2018-04-28 NOTE — Patient Instructions (Signed)

## 2018-04-28 NOTE — Telephone Encounter (Signed)
Scheduled appt per 8/23 los - gave patient aVS and calender per los.  

## 2018-04-29 ENCOUNTER — Encounter: Payer: Self-pay | Admitting: Hematology

## 2018-05-17 DIAGNOSIS — M1712 Unilateral primary osteoarthritis, left knee: Secondary | ICD-10-CM | POA: Diagnosis not present

## 2018-05-17 DIAGNOSIS — M1711 Unilateral primary osteoarthritis, right knee: Secondary | ICD-10-CM | POA: Diagnosis not present

## 2018-05-19 DIAGNOSIS — Z6836 Body mass index (BMI) 36.0-36.9, adult: Secondary | ICD-10-CM | POA: Diagnosis not present

## 2018-05-19 DIAGNOSIS — Z0000 Encounter for general adult medical examination without abnormal findings: Secondary | ICD-10-CM | POA: Diagnosis not present

## 2018-05-19 DIAGNOSIS — E782 Mixed hyperlipidemia: Secondary | ICD-10-CM | POA: Diagnosis not present

## 2018-05-19 DIAGNOSIS — D509 Iron deficiency anemia, unspecified: Secondary | ICD-10-CM | POA: Diagnosis not present

## 2018-07-31 ENCOUNTER — Other Ambulatory Visit: Payer: Self-pay

## 2018-07-31 ENCOUNTER — Inpatient Hospital Stay: Payer: BLUE CROSS/BLUE SHIELD | Attending: Hematology and Oncology

## 2018-07-31 DIAGNOSIS — N920 Excessive and frequent menstruation with regular cycle: Secondary | ICD-10-CM | POA: Insufficient documentation

## 2018-07-31 DIAGNOSIS — D500 Iron deficiency anemia secondary to blood loss (chronic): Secondary | ICD-10-CM

## 2018-07-31 DIAGNOSIS — D508 Other iron deficiency anemias: Secondary | ICD-10-CM | POA: Diagnosis not present

## 2018-07-31 DIAGNOSIS — E669 Obesity, unspecified: Secondary | ICD-10-CM | POA: Diagnosis not present

## 2018-07-31 LAB — CMP (CANCER CENTER ONLY)
ALBUMIN: 3.5 g/dL (ref 3.5–5.0)
ALK PHOS: 45 U/L (ref 38–126)
ALT: 12 U/L (ref 0–44)
ANION GAP: 8 (ref 5–15)
AST: 17 U/L (ref 15–41)
BUN: 8 mg/dL (ref 6–20)
CALCIUM: 9 mg/dL (ref 8.9–10.3)
CHLORIDE: 109 mmol/L (ref 98–111)
CO2: 24 mmol/L (ref 22–32)
Creatinine: 0.78 mg/dL (ref 0.44–1.00)
GFR, Estimated: >60 mL/min (ref >60)
GLUCOSE: 88 mg/dL (ref 70–99)
POTASSIUM: 3.8 mmol/L (ref 3.5–5.1)
SODIUM: 141 mmol/L (ref 135–145)
Total Bilirubin: 0.4 mg/dL (ref 0.3–1.2)
Total Protein: 6.9 g/dL (ref 6.5–8.1)

## 2018-07-31 LAB — CBC WITH DIFFERENTIAL (CANCER CENTER ONLY)
Abs Immature Granulocytes: 0 10*3/uL (ref 0.00–0.07)
BASOS ABS: 0.1 10*3/uL (ref 0.0–0.1)
BASOS PCT: 1 %
EOS ABS: 0.1 10*3/uL (ref 0.0–0.5)
EOS PCT: 2 %
HCT: 39 % (ref 36.0–46.0)
HEMOGLOBIN: 12.5 g/dL (ref 12.0–15.0)
Immature Granulocytes: 0 %
LYMPHS PCT: 47 %
Lymphs Abs: 1.9 10*3/uL (ref 0.7–4.0)
MCH: 30 pg (ref 26.0–34.0)
MCHC: 32.1 g/dL (ref 30.0–36.0)
MCV: 93.8 fL (ref 80.0–100.0)
MONO ABS: 0.3 10*3/uL (ref 0.1–1.0)
Monocytes Relative: 8 %
NRBC: 0 % (ref 0.0–0.2)
Neutro Abs: 1.7 10*3/uL (ref 1.7–7.7)
Neutrophils Relative %: 42 %
PLATELETS: 263 10*3/uL (ref 150–400)
RBC: 4.16 MIL/uL (ref 3.87–5.11)
RDW: 12.6 % (ref 11.5–15.5)
WBC: 4.1 10*3/uL (ref 4.0–10.5)

## 2018-07-31 LAB — IRON AND TIBC
IRON: 67 ug/dL (ref 41–142)
SATURATION RATIOS: 23 % (ref 21–57)
TIBC: 289 ug/dL (ref 236–444)
UIBC: 222 ug/dL (ref 120–384)

## 2018-07-31 LAB — FERRITIN: Ferritin: 66 ng/mL (ref 11–307)

## 2018-08-17 ENCOUNTER — Encounter: Payer: Self-pay | Admitting: Hematology

## 2018-09-15 ENCOUNTER — Encounter: Payer: Self-pay | Admitting: Obstetrics & Gynecology

## 2018-09-15 ENCOUNTER — Encounter

## 2018-09-15 ENCOUNTER — Ambulatory Visit (INDEPENDENT_AMBULATORY_CARE_PROVIDER_SITE_OTHER): Payer: Commercial Managed Care - PPO | Admitting: Obstetrics & Gynecology

## 2018-09-15 ENCOUNTER — Other Ambulatory Visit (HOSPITAL_COMMUNITY)
Admission: RE | Admit: 2018-09-15 | Discharge: 2018-09-15 | Disposition: A | Discharge status: Home / Self Care | Payer: Commercial Managed Care - PPO | Source: Ambulatory Visit | Attending: Obstetrics & Gynecology | Admitting: Obstetrics & Gynecology

## 2018-09-15 VITALS — BP 110/70 | HR 76 | Resp 18 | Ht 62.75 in | Wt 201.4 lb

## 2018-09-15 DIAGNOSIS — N912 Amenorrhea, unspecified: Secondary | ICD-10-CM | POA: Diagnosis not present

## 2018-09-15 DIAGNOSIS — Z124 Encounter for screening for malignant neoplasm of cervix: Secondary | ICD-10-CM

## 2018-09-15 DIAGNOSIS — Z01419 Encounter for gynecological examination (general) (routine) without abnormal findings: Secondary | ICD-10-CM | POA: Diagnosis not present

## 2018-09-15 LAB — BETA HCG QUANT (REF LAB): hCG Quant: 2 m[IU]/mL
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
## 2018-09-16 LAB — FOLLICLE STIMULATING HORMONE: FSH: 61 m[IU]/mL

## 2018-09-16 LAB — TSH: TSH: 1.83 u[IU]/mL (ref 0.450–4.500)

## 2018-09-16 LAB — PROLACTIN: PROLACTIN: 13 ng/mL (ref 4.8–23.3)

## 2018-09-18 ENCOUNTER — Encounter: Payer: Self-pay | Admitting: Obstetrics & Gynecology

## 2018-09-18 LAB — CYTOLOGY - PAP: Diagnosis: NEGATIVE

## 2018-09-20 NOTE — Addendum Note (Signed)
Addended by: Jerene Bears on: 09/20/2018 10:05 AM   Modules accepted: Orders

## 2018-09-21 ENCOUNTER — Telehealth: Payer: Self-pay | Admitting: Emergency Medicine

## 2018-09-21 NOTE — Telephone Encounter (Signed)
Patient has viewed results on mychart.   Message left to return call to [place] at [phone].    Needs lab appointment for [facility] per Dr. Hyacinth Meeker.

## 2018-09-21 NOTE — Telephone Encounter (Signed)
-----   Message from Jerene Bears, MD sent at 09/20/2018 10:04 AM EST ----- Please let pt know her pap was normal.  02 recall.  She has recently had an episode of amenorrhea.  Her prolactin is normal, HCG negative, TSH normal.  Her FSh is elevated showing concern for menopause.  I do want her to return and have an AMH level done as this will help me fully know her ovarian reserve level.    She will be upset by this as she has been working on weight loss for the past 2-3 years and her husband has not been interested in pursuing pregnancy.  She was upset when I advised her I would also be testing the [facility].  She may need a referral to [facility] as well but I would like to do the [facility] level.  Order has been placed.    CC:  Gara Kroner.

## 2018-09-29 NOTE — Telephone Encounter (Signed)
Spoke with patient, advised as seen below per Dr.  Hyacinth Meeker. Lab appt scheduled for 2/6 at 11am. Questions answered. Patient verbalizes understanding and is agreeable to plan.   Next AEX 01/25/20  Encounter closed.

## 2018-09-30 ENCOUNTER — Encounter: Payer: Self-pay | Admitting: Obstetrics & Gynecology

## 2018-10-12 ENCOUNTER — Other Ambulatory Visit (INDEPENDENT_AMBULATORY_CARE_PROVIDER_SITE_OTHER): Payer: Commercial Managed Care - PPO

## 2018-10-12 DIAGNOSIS — N912 Amenorrhea, unspecified: Secondary | ICD-10-CM

## 2018-10-16 ENCOUNTER — Encounter: Payer: Self-pay | Admitting: Hematology

## 2018-10-17 ENCOUNTER — Other Ambulatory Visit: Payer: Self-pay | Admitting: Obstetrics & Gynecology

## 2018-10-17 DIAGNOSIS — N912 Amenorrhea, unspecified: Secondary | ICD-10-CM

## 2018-10-18 ENCOUNTER — Telehealth: Payer: Self-pay | Admitting: Hematology

## 2018-10-18 NOTE — Telephone Encounter (Signed)
Call day 2/28 - moved appointments to earlier time. Spoke with patient and per patient she cannot come in earlier, has already made arrangements for 2/28 @ 2 pm and cannot rearrange schedule. Spoke with YF and patient's appointments can be restored to original time of 2 pm for f/u and 2:45 pm for infusion. Spoke with patient she is aware.

## 2018-10-19 LAB — ANTI MULLERIAN HORMONE: ANTI-MULLERIAN HORMONE (AMH): 0.02 ng/mL

## 2018-10-22 ENCOUNTER — Encounter: Payer: Self-pay | Admitting: Obstetrics & Gynecology

## 2018-10-23 ENCOUNTER — Telehealth: Payer: Self-pay | Admitting: Obstetrics & Gynecology

## 2018-10-23 DIAGNOSIS — E2839 Other primary ovarian failure: Secondary | ICD-10-CM

## 2018-10-23 NOTE — Telephone Encounter (Signed)
Message left to return call to Elysian at [phone].   April Manson referral pended

## 2018-10-23 NOTE — Telephone Encounter (Signed)
Patient sent the following correspondence through MyChart. Routing to triage to assist patient with request.  Good evening Dr. Hyacinth Meeker,     I just wanted to touch base with you on the results of my lab work from Thursday, February 6th. Are they back?    Thanks,  [name]

## 2018-10-23 NOTE — Telephone Encounter (Signed)
-----   Message from Jerene Bears, MD sent at 10/20/2018 12:45 AM EST ----- Please let pt know her AMH is also low, consistent with the increased FSH levels and ovarian insuffiency.  She does need to see, [facility], Dr. April Manson would be my recommendation, if desires pregnancy.  I think it would be a good idea for her to see him anyway even if not going to pursue anything right now so she knows options.  Can I refer her?

## 2018-10-27 ENCOUNTER — Encounter: Payer: Self-pay | Admitting: Obstetrics & Gynecology

## 2018-10-27 NOTE — Telephone Encounter (Signed)
Spoke with patient and she is given results from Dr. Hyacinth Meeker.  Agreeable to referral to Dr. April Manson.  Referral placed.  Encounter closed.

## 2018-10-30 ENCOUNTER — Inpatient Hospital Stay: Payer: Commercial Managed Care - PPO | Attending: Hematology

## 2018-10-30 DIAGNOSIS — N920 Excessive and frequent menstruation with regular cycle: Secondary | ICD-10-CM | POA: Diagnosis not present

## 2018-10-30 DIAGNOSIS — D500 Iron deficiency anemia secondary to blood loss (chronic): Secondary | ICD-10-CM | POA: Diagnosis present

## 2018-10-30 LAB — CBC WITH DIFFERENTIAL (CANCER CENTER ONLY)
Abs Immature Granulocytes: 0.03 10*3/uL (ref 0.00–0.07)
Basophils Absolute: 0.1 10*3/uL (ref 0.0–0.1)
Basophils Relative: 1 %
Eosinophils Absolute: 0.1 10*3/uL (ref 0.0–0.5)
Eosinophils Relative: 3 %
HCT: 38.8 % (ref 36.0–46.0)
Hemoglobin: 12.3 g/dL (ref 12.0–15.0)
Immature Granulocytes: 1 %
Lymphocytes Relative: 43 %
Lymphs Abs: 1.6 10*3/uL (ref 0.7–4.0)
MCH: 29.6 pg (ref 26.0–34.0)
MCHC: 31.7 g/dL (ref 30.0–36.0)
MCV: 93.5 fL (ref 80.0–100.0)
MONO ABS: 0.3 10*3/uL (ref 0.1–1.0)
MONOS PCT: 9 %
Neutro Abs: 1.7 10*3/uL (ref 1.7–7.7)
Neutrophils Relative %: 43 %
Platelet Count: 235 10*3/uL (ref 150–400)
RBC: 4.15 MIL/uL (ref 3.87–5.11)
RDW: 13 % (ref 11.5–15.5)
WBC Count: 3.8 10*3/uL — ABNORMAL LOW (ref 4.0–10.5)
nRBC: 0 % (ref 0.0–0.2)

## 2018-10-30 LAB — COMPREHENSIVE METABOLIC PANEL
ALK PHOS: 41 U/L (ref 38–126)
ALT: 11 U/L (ref 0–44)
AST: 17 U/L (ref 15–41)
Albumin: 3.4 g/dL — ABNORMAL LOW (ref 3.5–5.0)
Anion gap: 8 (ref 5–15)
BUN: 8 mg/dL (ref 6–20)
CALCIUM: 8.3 mg/dL — AB (ref 8.9–10.3)
CO2: 23 mmol/L (ref 22–32)
Chloride: 110 mmol/L (ref 98–111)
Creatinine, Ser: 0.76 mg/dL (ref 0.44–1.00)
GFR calc Af Amer: >60 mL/min (ref >60)
GFR calc non Af Amer: >60 mL/min (ref >60)
Glucose, Bld: 84 mg/dL (ref 70–99)
Potassium: 3.5 mmol/L (ref 3.5–5.1)
Sodium: 141 mmol/L (ref 135–145)
Total Bilirubin: 0.5 mg/dL (ref 0.3–1.2)
Total Protein: 6.3 g/dL — ABNORMAL LOW (ref 6.5–8.1)

## 2018-10-30 LAB — IRON AND TIBC
Iron: 93 ug/dL (ref 41–142)
Saturation Ratios: 34 % (ref 21–57)
TIBC: 270 ug/dL (ref 236–444)
UIBC: 178 ug/dL (ref 120–384)

## 2018-10-30 LAB — FERRITIN: Ferritin: 64 ng/mL (ref 11–307)
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
## 2018-11-02 ENCOUNTER — Encounter: Payer: Self-pay | Admitting: Hematology

## 2018-11-03 ENCOUNTER — Ambulatory Visit: Payer: Commercial Managed Care - PPO

## 2018-11-03 ENCOUNTER — Inpatient Hospital Stay (HOSPITAL_BASED_OUTPATIENT_CLINIC_OR_DEPARTMENT_OTHER): Payer: Commercial Managed Care - PPO | Admitting: Hematology

## 2018-11-03 ENCOUNTER — Inpatient Hospital Stay: Payer: Commercial Managed Care - PPO

## 2018-11-03 ENCOUNTER — Ambulatory Visit: Payer: BLUE CROSS/BLUE SHIELD | Admitting: Hematology

## 2018-11-03 ENCOUNTER — Telehealth: Payer: Self-pay | Admitting: Hematology

## 2018-11-03 ENCOUNTER — Encounter: Payer: Self-pay | Admitting: Hematology

## 2018-11-03 VITALS — BP 110/51 | HR 73 | Temp 98.5°F | Resp 20

## 2018-11-03 VITALS — BP 133/82 | HR 76 | Temp 97.7°F | Resp 17 | Ht 63.0 in | Wt 204.2 lb

## 2018-11-03 DIAGNOSIS — D500 Iron deficiency anemia secondary to blood loss (chronic): Secondary | ICD-10-CM

## 2018-11-03 DIAGNOSIS — N920 Excessive and frequent menstruation with regular cycle: Secondary | ICD-10-CM

## 2018-11-03 MED ORDER — SODIUM CHLORIDE 0.9 % IV SOLN
INTRAVENOUS | Status: DC
  Administered 2018-11-03: 15:00:00 via INTRAVENOUS
  Filled 2018-11-03: qty 250

## 2018-11-03 MED ORDER — SODIUM CHLORIDE 0.9 % IV SOLN
510.0000 mg | Freq: Once | INTRAVENOUS | Status: AC
  Administered 2018-11-03: 510 mg via INTRAVENOUS
  Filled 2018-11-03: qty 17

## 2018-11-03 NOTE — Telephone Encounter (Signed)
Scheduled appt per 2/28 los. ° °Printed calendar and avs. °

## 2018-11-03 NOTE — Patient Instructions (Signed)

## 2019-02-01 ENCOUNTER — Other Ambulatory Visit: Payer: Self-pay

## 2019-02-01 ENCOUNTER — Inpatient Hospital Stay: Payer: Commercial Managed Care - PPO | Attending: Hematology

## 2019-02-01 DIAGNOSIS — N920 Excessive and frequent menstruation with regular cycle: Secondary | ICD-10-CM | POA: Insufficient documentation

## 2019-02-01 DIAGNOSIS — D500 Iron deficiency anemia secondary to blood loss (chronic): Secondary | ICD-10-CM | POA: Insufficient documentation

## 2019-02-01 LAB — CBC WITH DIFFERENTIAL (CANCER CENTER ONLY)
Abs Immature Granulocytes: 0 10*3/uL (ref 0.00–0.07)
Basophils Absolute: 0.1 10*3/uL (ref 0.0–0.1)
Basophils Relative: 1 %
Eosinophils Absolute: 0.2 10*3/uL (ref 0.0–0.5)
Eosinophils Relative: 4 %
HCT: 40.7 % (ref 36.0–46.0)
Hemoglobin: 13.1 g/dL (ref 12.0–15.0)
Immature Granulocytes: 0 %
Lymphocytes Relative: 49 %
Lymphs Abs: 2 10*3/uL (ref 0.7–4.0)
MCH: 30.3 pg (ref 26.0–34.0)
MCHC: 32.2 g/dL (ref 30.0–36.0)
MCV: 94.2 fL (ref 80.0–100.0)
Monocytes Absolute: 0.3 10*3/uL (ref 0.1–1.0)
Monocytes Relative: 8 %
Neutro Abs: 1.5 10*3/uL — ABNORMAL LOW (ref 1.7–7.7)
Neutrophils Relative %: 38 %
Platelet Count: 264 10*3/uL (ref 150–400)
RBC: 4.32 MIL/uL (ref 3.87–5.11)
RDW: 12.1 % (ref 11.5–15.5)
WBC Count: 4 10*3/uL (ref 4.0–10.5)
nRBC: 0 % (ref 0.0–0.2)

## 2019-02-01 LAB — IRON AND TIBC
Iron: 59 ug/dL (ref 41–142)
Saturation Ratios: 22 % (ref 21–57)
TIBC: 269 ug/dL (ref 236–444)
UIBC: 209 ug/dL (ref 120–384)

## 2019-02-01 LAB — CMP (CANCER CENTER ONLY)
ALT: 16 U/L (ref 0–44)
AST: 19 U/L (ref 15–41)
Albumin: 3.5 g/dL (ref 3.5–5.0)
Alkaline Phosphatase: 45 U/L (ref 38–126)
Anion gap: 7 (ref 5–15)
BUN: 8 mg/dL (ref 6–20)
CO2: 24 mmol/L (ref 22–32)
Calcium: 8.5 mg/dL — ABNORMAL LOW (ref 8.9–10.3)
Chloride: 108 mmol/L (ref 98–111)
Creatinine: 0.78 mg/dL (ref 0.44–1.00)
GFR, Est AFR Am: >60 mL/min (ref >60)
GFR, Estimated: >60 mL/min (ref >60)
Glucose, Bld: 81 mg/dL (ref 70–99)
Potassium: 3.3 mmol/L — ABNORMAL LOW (ref 3.5–5.1)
Sodium: 139 mmol/L (ref 135–145)
Total Bilirubin: 0.3 mg/dL (ref 0.3–1.2)
Total Protein: 6.9 g/dL (ref 6.5–8.1)

## 2019-02-01 LAB — FERRITIN: Ferritin: 141 ng/mL (ref 11–307)

## 2019-02-05 ENCOUNTER — Telehealth: Payer: Self-pay

## 2019-02-05 NOTE — Telephone Encounter (Signed)
Spoke with patient regarding lab results.  Per Dr. Mosetta Putt iron level is adequate, no need for IV iron at presents.  Patient verbalized an understanding.

## 2019-02-05 NOTE — Telephone Encounter (Signed)
-----   Message from Malachy Mood, MD sent at 02/04/2019 10:50 PM EDT ----- Please let pt know her lab results, iron level adequate, no need iv iron, thanks   Malachy Mood  02/04/2019

## 2019-04-13 ENCOUNTER — Encounter: Payer: Self-pay | Admitting: Obstetrics & Gynecology

## 2019-05-02 ENCOUNTER — Other Ambulatory Visit: Payer: Self-pay

## 2019-05-02 ENCOUNTER — Inpatient Hospital Stay: Payer: Commercial Managed Care - PPO | Attending: Hematology

## 2019-05-02 DIAGNOSIS — N920 Excessive and frequent menstruation with regular cycle: Secondary | ICD-10-CM | POA: Insufficient documentation

## 2019-05-02 DIAGNOSIS — D500 Iron deficiency anemia secondary to blood loss (chronic): Secondary | ICD-10-CM

## 2019-05-02 DIAGNOSIS — Z79899 Other long term (current) drug therapy: Secondary | ICD-10-CM | POA: Insufficient documentation

## 2019-05-02 DIAGNOSIS — D509 Iron deficiency anemia, unspecified: Secondary | ICD-10-CM | POA: Insufficient documentation

## 2019-05-02 LAB — CBC WITH DIFFERENTIAL (CANCER CENTER ONLY)
Abs Immature Granulocytes: 0 10*3/uL (ref 0.00–0.07)
Basophils Absolute: 0 10*3/uL (ref 0.0–0.1)
Basophils Relative: 1 %
Eosinophils Absolute: 0.1 10*3/uL (ref 0.0–0.5)
Eosinophils Relative: 3 %
HCT: 35.7 % — ABNORMAL LOW (ref 36.0–46.0)
Hemoglobin: 11.6 g/dL — ABNORMAL LOW (ref 12.0–15.0)
Immature Granulocytes: 0 %
Lymphocytes Relative: 45 %
Lymphs Abs: 1.8 10*3/uL (ref 0.7–4.0)
MCH: 30.1 pg (ref 26.0–34.0)
MCHC: 32.5 g/dL (ref 30.0–36.0)
MCV: 92.7 fL (ref 80.0–100.0)
Monocytes Absolute: 0.3 10*3/uL (ref 0.1–1.0)
Monocytes Relative: 9 %
Neutro Abs: 1.6 10*3/uL — ABNORMAL LOW (ref 1.7–7.7)
Neutrophils Relative %: 42 %
Platelet Count: 259 10*3/uL (ref 150–400)
RBC: 3.85 MIL/uL — ABNORMAL LOW (ref 3.87–5.11)
RDW: 12.5 % (ref 11.5–15.5)
WBC Count: 3.9 10*3/uL — ABNORMAL LOW (ref 4.0–10.5)
nRBC: 0 % (ref 0.0–0.2)

## 2019-05-02 LAB — CMP (CANCER CENTER ONLY)
ALT: 13 U/L (ref 0–44)
AST: 24 U/L (ref 15–41)
Albumin: 3.5 g/dL (ref 3.5–5.0)
Alkaline Phosphatase: 44 U/L (ref 38–126)
Anion gap: 9 (ref 5–15)
BUN: 9 mg/dL (ref 6–20)
CO2: 24 mmol/L (ref 22–32)
Calcium: 8.5 mg/dL — ABNORMAL LOW (ref 8.9–10.3)
Chloride: 108 mmol/L (ref 98–111)
Creatinine: 0.82 mg/dL (ref 0.44–1.00)
GFR, Est AFR Am: >60 mL/min (ref >60)
GFR, Estimated: >60 mL/min (ref >60)
Glucose, Bld: 87 mg/dL (ref 70–99)
Potassium: 3.6 mmol/L (ref 3.5–5.1)
Sodium: 141 mmol/L (ref 135–145)
Total Bilirubin: <0.2 mg/dL — ABNORMAL LOW (ref 0.3–1.2)
Total Protein: 6.5 g/dL (ref 6.5–8.1)

## 2019-05-02 LAB — IRON AND TIBC
Iron: 42 ug/dL (ref 41–142)
Saturation Ratios: 16 % — ABNORMAL LOW (ref 21–57)
TIBC: 263 ug/dL (ref 236–444)
UIBC: 220 ug/dL (ref 120–384)

## 2019-05-02 LAB — FERRITIN: Ferritin: 87 ng/mL (ref 11–307)
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
## 2019-05-04 ENCOUNTER — Inpatient Hospital Stay: Payer: Commercial Managed Care - PPO

## 2019-05-04 ENCOUNTER — Inpatient Hospital Stay (HOSPITAL_BASED_OUTPATIENT_CLINIC_OR_DEPARTMENT_OTHER): Payer: Commercial Managed Care - PPO | Admitting: Hematology

## 2019-05-04 ENCOUNTER — Other Ambulatory Visit: Payer: Self-pay

## 2019-05-04 VITALS — BP 108/68 | HR 71 | Temp 98.3°F | Resp 18 | Ht 63.0 in | Wt 183.0 lb

## 2019-05-04 VITALS — BP 103/63 | HR 71 | Temp 98.6°F | Resp 16

## 2019-05-04 DIAGNOSIS — D500 Iron deficiency anemia secondary to blood loss (chronic): Secondary | ICD-10-CM

## 2019-05-04 DIAGNOSIS — D509 Iron deficiency anemia, unspecified: Secondary | ICD-10-CM | POA: Diagnosis not present

## 2019-05-04 MED ORDER — SODIUM CHLORIDE 0.9 % IV SOLN
INTRAVENOUS | Status: DC
  Administered 2019-05-04: 15:00:00 via INTRAVENOUS
  Filled 2019-05-04: qty 250

## 2019-05-04 MED ORDER — SODIUM CHLORIDE 0.9 % IV SOLN
510.0000 mg | Freq: Once | INTRAVENOUS | Status: AC
  Administered 2019-05-04: 16:00:00 510 mg via INTRAVENOUS
  Filled 2019-05-04: qty 510

## 2019-05-04 NOTE — Patient Instructions (Signed)

## 2019-05-05 ENCOUNTER — Encounter: Payer: Self-pay | Admitting: Hematology

## 2019-05-07 ENCOUNTER — Telehealth: Payer: Self-pay | Admitting: Hematology

## 2019-05-07 NOTE — Telephone Encounter (Signed)
Scheduled appt per 8/31 staff message.  Called patient and she is aware of her appt date and time.

## 2019-05-07 NOTE — Telephone Encounter (Signed)
No los per 8/29 

## 2019-08-03 ENCOUNTER — Other Ambulatory Visit: Payer: Self-pay

## 2019-08-03 ENCOUNTER — Inpatient Hospital Stay: Payer: Commercial Managed Care - PPO | Attending: Hematology

## 2019-08-03 DIAGNOSIS — D509 Iron deficiency anemia, unspecified: Secondary | ICD-10-CM | POA: Insufficient documentation

## 2019-08-03 DIAGNOSIS — Z79899 Other long term (current) drug therapy: Secondary | ICD-10-CM | POA: Insufficient documentation

## 2019-08-03 DIAGNOSIS — N920 Excessive and frequent menstruation with regular cycle: Secondary | ICD-10-CM | POA: Insufficient documentation

## 2019-08-03 DIAGNOSIS — D500 Iron deficiency anemia secondary to blood loss (chronic): Secondary | ICD-10-CM

## 2019-08-03 LAB — CMP (CANCER CENTER ONLY)
ALT: 13 U/L (ref 0–44)
AST: 20 U/L (ref 15–41)
Albumin: 3.5 g/dL (ref 3.5–5.0)
Alkaline Phosphatase: 52 U/L (ref 38–126)
Anion gap: 10 (ref 5–15)
BUN: 8 mg/dL (ref 6–20)
CO2: 26 mmol/L (ref 22–32)
Calcium: 8.4 mg/dL — ABNORMAL LOW (ref 8.9–10.3)
Chloride: 106 mmol/L (ref 98–111)
Creatinine: 0.79 mg/dL (ref 0.44–1.00)
GFR, Est AFR Am: >60 mL/min (ref >60)
GFR, Estimated: >60 mL/min (ref >60)
Glucose, Bld: 81 mg/dL (ref 70–99)
Potassium: 3.8 mmol/L (ref 3.5–5.1)
Sodium: 142 mmol/L (ref 135–145)
Total Bilirubin: 0.3 mg/dL (ref 0.3–1.2)
Total Protein: 6.5 g/dL (ref 6.5–8.1)

## 2019-08-03 LAB — CBC WITH DIFFERENTIAL (CANCER CENTER ONLY)
Abs Immature Granulocytes: 0 10*3/uL (ref 0.00–0.07)
Basophils Absolute: 0 10*3/uL (ref 0.0–0.1)
Basophils Relative: 1 %
Eosinophils Absolute: 0.1 10*3/uL (ref 0.0–0.5)
Eosinophils Relative: 2 %
HCT: 37.8 % (ref 36.0–46.0)
Hemoglobin: 12.5 g/dL (ref 12.0–15.0)
Immature Granulocytes: 0 %
Lymphocytes Relative: 49 %
Lymphs Abs: 2 10*3/uL (ref 0.7–4.0)
MCH: 30.6 pg (ref 26.0–34.0)
MCHC: 33.1 g/dL (ref 30.0–36.0)
MCV: 92.6 fL (ref 80.0–100.0)
Monocytes Absolute: 0.4 10*3/uL (ref 0.1–1.0)
Monocytes Relative: 10 %
Neutro Abs: 1.6 10*3/uL — ABNORMAL LOW (ref 1.7–7.7)
Neutrophils Relative %: 38 %
Platelet Count: 273 10*3/uL (ref 150–400)
RBC: 4.08 MIL/uL (ref 3.87–5.11)
RDW: 12.2 % (ref 11.5–15.5)
WBC Count: 4.2 10*3/uL (ref 4.0–10.5)
nRBC: 0 % (ref 0.0–0.2)

## 2019-08-03 LAB — IRON AND TIBC
Iron: 72 ug/dL (ref 41–142)
Saturation Ratios: 29 % (ref 21–57)
TIBC: 250 ug/dL (ref 236–444)
UIBC: 178 ug/dL (ref 120–384)

## 2019-08-03 LAB — FERRITIN: Ferritin: 104 ng/mL (ref 11–307)

## 2019-08-05 ENCOUNTER — Encounter: Payer: Self-pay | Admitting: Hematology

## 2019-08-14 HISTORY — PX: COSMETIC SURGERY: SHX468

## 2019-08-14 HISTORY — PX: OTHER SURGICAL HISTORY: SHX169

## 2019-10-05 ENCOUNTER — Other Ambulatory Visit: Payer: Self-pay

## 2019-10-09 ENCOUNTER — Encounter: Payer: Self-pay | Admitting: Obstetrics & Gynecology

## 2019-10-09 ENCOUNTER — Other Ambulatory Visit: Payer: Self-pay

## 2019-10-09 ENCOUNTER — Ambulatory Visit (INDEPENDENT_AMBULATORY_CARE_PROVIDER_SITE_OTHER): Payer: Commercial Managed Care - PPO | Admitting: Obstetrics & Gynecology

## 2019-10-09 ENCOUNTER — Other Ambulatory Visit (HOSPITAL_COMMUNITY)
Admission: RE | Admit: 2019-10-09 | Discharge: 2019-10-09 | Disposition: A | Discharge status: Home / Self Care | Payer: Commercial Managed Care - PPO | Source: Ambulatory Visit | Attending: Obstetrics & Gynecology | Admitting: Obstetrics & Gynecology

## 2019-10-09 VITALS — BP 120/70 | HR 76 | Temp 97.2°F | Resp 12 | Ht 62.5 in | Wt 199.0 lb

## 2019-10-09 DIAGNOSIS — N970 Female infertility associated with anovulation: Secondary | ICD-10-CM

## 2019-10-09 DIAGNOSIS — Z124 Encounter for screening for malignant neoplasm of cervix: Secondary | ICD-10-CM | POA: Diagnosis not present

## 2019-10-09 DIAGNOSIS — Z01419 Encounter for gynecological examination (general) (routine) without abnormal findings: Secondary | ICD-10-CM | POA: Diagnosis not present

## 2019-10-09 DIAGNOSIS — Z113 Encounter for screening for infections with a predominantly sexual mode of transmission: Secondary | ICD-10-CM | POA: Diagnosis not present
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
## 2019-10-10 LAB — HEP, RPR, HIV PANEL
HIV Screen 4th Generation wRfx: NONREACTIVE
Hepatitis B Surface Ag: NEGATIVE
RPR Ser Ql: NONREACTIVE

## 2019-10-11 LAB — CYTOLOGY - PAP
Chlamydia: NEGATIVE
Comment: NEGATIVE
Comment: NEGATIVE
Comment: NEGATIVE
Comment: NORMAL
Diagnosis: UNDETERMINED — AB
High risk HPV: NEGATIVE
Neisseria Gonorrhea: NEGATIVE
Trichomonas: NEGATIVE

## 2019-11-02 ENCOUNTER — Inpatient Hospital Stay: Payer: Commercial Managed Care - PPO | Attending: Nurse Practitioner

## 2019-11-02 ENCOUNTER — Other Ambulatory Visit: Payer: Self-pay

## 2019-11-02 DIAGNOSIS — E669 Obesity, unspecified: Secondary | ICD-10-CM | POA: Insufficient documentation

## 2019-11-02 DIAGNOSIS — D500 Iron deficiency anemia secondary to blood loss (chronic): Secondary | ICD-10-CM | POA: Diagnosis not present

## 2019-11-02 DIAGNOSIS — N920 Excessive and frequent menstruation with regular cycle: Secondary | ICD-10-CM | POA: Diagnosis not present

## 2019-11-02 LAB — CMP (CANCER CENTER ONLY)
ALT: 10 U/L (ref 0–44)
AST: 15 U/L (ref 15–41)
Albumin: 3.7 g/dL (ref 3.5–5.0)
Alkaline Phosphatase: 58 U/L (ref 38–126)
Anion gap: 8 (ref 5–15)
BUN: 8 mg/dL (ref 6–20)
CO2: 26 mmol/L (ref 22–32)
Calcium: 8.5 mg/dL — ABNORMAL LOW (ref 8.9–10.3)
Chloride: 107 mmol/L (ref 98–111)
Creatinine: 0.77 mg/dL (ref 0.44–1.00)
GFR, Est AFR Am: >60 mL/min (ref >60)
GFR, Estimated: >60 mL/min (ref >60)
Glucose, Bld: 85 mg/dL (ref 70–99)
Potassium: 3.7 mmol/L (ref 3.5–5.1)
Sodium: 141 mmol/L (ref 135–145)
Total Bilirubin: 0.4 mg/dL (ref 0.3–1.2)
Total Protein: 7 g/dL (ref 6.5–8.1)

## 2019-11-02 LAB — CBC WITH DIFFERENTIAL (CANCER CENTER ONLY)
Abs Immature Granulocytes: 0 10*3/uL (ref 0.00–0.07)
Basophils Absolute: 0.1 10*3/uL (ref 0.0–0.1)
Basophils Relative: 1 %
Eosinophils Absolute: 0.1 10*3/uL (ref 0.0–0.5)
Eosinophils Relative: 4 %
HCT: 41 % (ref 36.0–46.0)
Hemoglobin: 13.1 g/dL (ref 12.0–15.0)
Immature Granulocytes: 0 %
Lymphocytes Relative: 55 %
Lymphs Abs: 1.9 10*3/uL (ref 0.7–4.0)
MCH: 29.2 pg (ref 26.0–34.0)
MCHC: 32 g/dL (ref 30.0–36.0)
MCV: 91.3 fL (ref 80.0–100.0)
Monocytes Absolute: 0.3 10*3/uL (ref 0.1–1.0)
Monocytes Relative: 8 %
Neutro Abs: 1.1 10*3/uL — ABNORMAL LOW (ref 1.7–7.7)
Neutrophils Relative %: 32 %
Platelet Count: 238 10*3/uL (ref 150–400)
RBC: 4.49 MIL/uL (ref 3.87–5.11)
RDW: 12.7 % (ref 11.5–15.5)
WBC Count: 3.5 10*3/uL — ABNORMAL LOW (ref 4.0–10.5)
nRBC: 0 % (ref 0.0–0.2)

## 2019-11-02 LAB — IRON AND TIBC
Iron: 61 ug/dL (ref 41–142)
Saturation Ratios: 20 % — ABNORMAL LOW (ref 21–57)
TIBC: 299 ug/dL (ref 236–444)
UIBC: 238 ug/dL (ref 120–384)

## 2019-11-02 LAB — FERRITIN: Ferritin: 75 ng/mL (ref 11–307)

## 2019-11-05 ENCOUNTER — Telehealth: Payer: Self-pay

## 2019-11-05 ENCOUNTER — Telehealth: Payer: Self-pay | Admitting: *Deleted

## 2019-11-05 NOTE — Telephone Encounter (Signed)
TC from Pt stating she received a call in reference to her lab results Pt stated that she has been feeling fatigued and lethargic Informed what her saturation ratio was (20) and what the normal range was Pt stated that she would like to schedule an appointment for IV iron. Scheduling message sent.

## 2019-11-05 NOTE — Telephone Encounter (Signed)
-----   Message from Malachy Mood, MD sent at 11/04/2019 10:57 PM EST ----- Please let pt know her lab result. If she is symptomatic (fatigue, dizziness etc), please schedule IV iron once, if not, OK to continue monitoring.   Thanks  Malachy Mood  11/04/2019

## 2019-11-05 NOTE — Telephone Encounter (Signed)
Left message on identified vm to call back regarding how she is doing & if she needs iron infusion.

## 2019-11-06 ENCOUNTER — Telehealth: Payer: Self-pay | Admitting: Hematology

## 2019-11-06 NOTE — Telephone Encounter (Signed)
Scheduled apt per 3/1 sch message - pt aware of appt date and time

## 2019-11-16 ENCOUNTER — Other Ambulatory Visit: Payer: Self-pay

## 2019-11-16 ENCOUNTER — Inpatient Hospital Stay: Payer: Commercial Managed Care - PPO | Attending: Hematology

## 2019-11-16 VITALS — BP 120/74 | HR 72 | Temp 98.9°F | Resp 20

## 2019-11-16 DIAGNOSIS — D500 Iron deficiency anemia secondary to blood loss (chronic): Secondary | ICD-10-CM | POA: Diagnosis present

## 2019-11-16 DIAGNOSIS — N920 Excessive and frequent menstruation with regular cycle: Secondary | ICD-10-CM | POA: Insufficient documentation

## 2019-11-16 MED ORDER — SODIUM CHLORIDE 0.9 % IV SOLN
Freq: Once | INTRAVENOUS | Status: AC
  Filled 2019-11-16: qty 250

## 2019-11-16 MED ORDER — SODIUM CHLORIDE 0.9 % IV SOLN
510.0000 mg | Freq: Once | INTRAVENOUS | Status: AC
  Administered 2019-11-16: 510 mg via INTRAVENOUS
  Filled 2019-11-16: qty 510

## 2019-11-16 NOTE — Patient Instructions (Signed)
Ferumoxytol injection What is this medicine? FERUMOXYTOL is an iron complex. Iron is used to make healthy red blood cells, which carry oxygen and nutrients throughout the body. This medicine is used to treat iron deficiency anemia. This medicine may be used for other purposes; ask your health care provider or pharmacist if you have questions. COMMON BRAND NAME(S): Feraheme What should I tell my health care provider before I take this medicine? They need to know if you have any of these conditions:  anemia not caused by low iron levels  high levels of iron in the blood  magnetic resonance imaging (MRI) test scheduled  an unusual or allergic reaction to iron, other medicines, foods, dyes, or preservatives  pregnant or trying to get pregnant  breast-feeding How should I use this medicine? This medicine is for injection into a vein. It is given by a health care professional in a hospital or clinic setting. Talk to your pediatrician regarding the use of this medicine in children. Special care may be needed. Overdosage: If you think you have taken too much of this medicine contact a poison control center or emergency room at once. NOTE: This medicine is only for you. Do not share this medicine with others. What if I miss a dose? It is important not to miss your dose. Call your doctor or health care professional if you are unable to keep an appointment. What may interact with this medicine? This medicine may interact with the following medications:  other iron products This list may not describe all possible interactions. Give your health care provider a list of all the medicines, herbs, non-prescription drugs, or dietary supplements you use. Also tell them if you smoke, drink alcohol, or use illegal drugs. Some items may interact with your medicine. What should I watch for while using this medicine? Visit your doctor or healthcare professional regularly. Tell your doctor or healthcare  professional if your symptoms do not start to get better or if they get worse. You may need blood work done while you are taking this medicine. You may need to follow a special diet. Talk to your doctor. Foods that contain iron include: whole grains/cereals, dried fruits, beans, or peas, leafy green vegetables, and organ meats (liver, kidney). What side effects may I notice from receiving this medicine? Side effects that you should report to your doctor or health care professional as soon as possible:  allergic reactions like skin rash, itching or hives, swelling of the face, lips, or tongue  breathing problems  changes in blood pressure  feeling faint or lightheaded, falls  fever or chills  flushing, sweating, or hot feelings  swelling of the ankles or feet Side effects that usually do not require medical attention (report to your doctor or health care professional if they continue or are bothersome):  diarrhea  headache  nausea, vomiting  stomach pain This list may not describe all possible side effects. Call your doctor for medical advice about side effects. You may report side effects to FDA at 1-800-FDA-1088. Where should I keep my medicine? This drug is given in a hospital or clinic and will not be stored at home. NOTE: This sheet is a summary. It may not cover all possible information. If you have questions about this medicine, talk to your doctor, pharmacist, or health care provider.  2020 Elsevier/Gold Standard (2016-10-11 20:21:10) Coronavirus (COVID-19) Are you at risk?  Are you at risk for the Coronavirus (COVID-19)?  To be considered HIGH RISK for Coronavirus (COVID-19),   you have to meet the following criteria:  . Traveled to [place], [place], [place], [place] or [place]; or in the [place] to [place], [place], [place], or [place]; and have fever, cough, and shortness of breath within the last 2 weeks of travel OR . Been in close contact with a person  diagnosed with COVID-19 within the last 2 weeks and have fever, cough, and shortness of breath . IF YOU DO NOT MEET THESE CRITERIA, YOU ARE CONSIDERED LOW RISK FOR COVID-19.  What to do if you are HIGH RISK for COVID-19?  . If you are having a medical emergency, call 911. . Seek medical care right away. Before you go to a doctor's office, urgent care or emergency department, call ahead and tell them about your recent travel, contact with someone diagnosed with COVID-19, and your symptoms. You should receive instructions from your physician's office regarding next steps of care.  . When you arrive at healthcare provider, tell the healthcare staff immediately you have returned from visiting [place], [place], [place], [place] or [place]; or traveled in the [place] to [place], [place], [place], or [place]; in the last two weeks or you have been in close contact with a person diagnosed with COVID-19 in the last 2 weeks.   . Tell the health care staff about your symptoms: fever, cough and shortness of breath. . After you have been seen by a medical provider, you will be either: o Tested for (COVID-19) and discharged home on quarantine except to seek medical care if symptoms worsen, and asked to  - Stay home and avoid contact with others until you get your results (4-5 days)  - Avoid travel on public transportation if possible (such as bus, train, or airplane) or o Sent to the Emergency Department by EMS for evaluation, COVID-19 testing, and possible admission depending on your condition and test results.  What to do if you are LOW RISK for COVID-19?  Reduce your risk of any infection by using the same precautions used for avoiding the common cold or flu:  . Wash your hands often with soap and warm water for at least 20 seconds.  If soap and water are not readily available, use an alcohol-based hand sanitizer with at least 60% alcohol.  . If coughing or sneezing, cover your mouth and nose by  coughing or sneezing into the elbow areas of your shirt or coat, into a tissue or into your sleeve (not your hands). . Avoid shaking hands with others and consider head nods or verbal greetings only. . Avoid touching your eyes, nose, or mouth with unwashed hands.  . Avoid close contact with people who are sick. . Avoid places or events with large numbers of people in one location, like concerts or sporting events. . Carefully consider travel plans you have or are making. . If you are planning any travel outside or inside the [place], visit the CDC's Travelers' Health webpage for the latest health notices. . If you have some symptoms but not all symptoms, continue to monitor at home and seek medical attention if your symptoms worsen. . If you are having a medical emergency, call 911.   ADDITIONAL HEALTHCARE OPTIONS FOR PATIENTS  [facility] / e-Visit: [email or web address]         [facility]: [phone]  [facility]: [phone]                   [facility]: [phone]   

## 2020-01-25 ENCOUNTER — Ambulatory Visit: Payer: Commercial Managed Care - PPO | Admitting: Obstetrics & Gynecology

## 2020-01-27 ENCOUNTER — Encounter: Payer: Self-pay | Admitting: Nurse Practitioner

## 2020-01-28 ENCOUNTER — Telehealth: Payer: Self-pay

## 2020-01-28 NOTE — Telephone Encounter (Signed)
Called patient in regard to her visit follow question in the in-basket in regard to her wanting to know if her appointment with Santiago Glad NP on 01/31/20 was in-person or virtual. I let her know that her appointment is scheduled to be an in-person visit. Patient stated that she did have something come up on that date but she would try to make it. If not she would need to schedule her appointment to be a phone visit depending the length on the appointment.

## 2020-01-29 ENCOUNTER — Other Ambulatory Visit: Payer: Self-pay

## 2020-01-29 ENCOUNTER — Inpatient Hospital Stay: Payer: Commercial Managed Care - PPO | Attending: Hematology

## 2020-01-29 ENCOUNTER — Telehealth: Payer: Self-pay

## 2020-01-29 ENCOUNTER — Encounter: Payer: Self-pay | Admitting: Nurse Practitioner

## 2020-01-29 DIAGNOSIS — D500 Iron deficiency anemia secondary to blood loss (chronic): Secondary | ICD-10-CM | POA: Diagnosis not present

## 2020-01-29 DIAGNOSIS — N920 Excessive and frequent menstruation with regular cycle: Secondary | ICD-10-CM | POA: Diagnosis present

## 2020-01-29 DIAGNOSIS — Z79899 Other long term (current) drug therapy: Secondary | ICD-10-CM | POA: Diagnosis not present

## 2020-01-29 LAB — CMP (CANCER CENTER ONLY)
ALT: 17 U/L (ref 0–44)
AST: 23 U/L (ref 15–41)
Albumin: 3.8 g/dL (ref 3.5–5.0)
Alkaline Phosphatase: 51 U/L (ref 38–126)
Anion gap: 10 (ref 5–15)
BUN: 7 mg/dL (ref 6–20)
CO2: 26 mmol/L (ref 22–32)
Calcium: 9 mg/dL (ref 8.9–10.3)
Chloride: 105 mmol/L (ref 98–111)
Creatinine: 0.85 mg/dL (ref 0.44–1.00)
GFR, Est AFR Am: >60 mL/min (ref >60)
GFR, Estimated: >60 mL/min (ref >60)
Glucose, Bld: 75 mg/dL (ref 70–99)
Potassium: 3.3 mmol/L — ABNORMAL LOW (ref 3.5–5.1)
Sodium: 141 mmol/L (ref 135–145)
Total Bilirubin: 0.4 mg/dL (ref 0.3–1.2)
Total Protein: 7.2 g/dL (ref 6.5–8.1)

## 2020-01-29 LAB — IRON AND TIBC
Iron: 91 ug/dL (ref 41–142)
Saturation Ratios: 31 % (ref 21–57)
TIBC: 290 ug/dL (ref 236–444)
UIBC: 199 ug/dL (ref 120–384)

## 2020-01-29 LAB — CBC WITH DIFFERENTIAL (CANCER CENTER ONLY)
Abs Immature Granulocytes: 0.01 10*3/uL (ref 0.00–0.07)
Basophils Absolute: 0 10*3/uL (ref 0.0–0.1)
Basophils Relative: 1 %
Eosinophils Absolute: 0.1 10*3/uL (ref 0.0–0.5)
Eosinophils Relative: 2 %
HCT: 38.8 % (ref 36.0–46.0)
Hemoglobin: 13 g/dL (ref 12.0–15.0)
Immature Granulocytes: 0 %
Lymphocytes Relative: 47 %
Lymphs Abs: 2.3 10*3/uL (ref 0.7–4.0)
MCH: 30 pg (ref 26.0–34.0)
MCHC: 33.5 g/dL (ref 30.0–36.0)
MCV: 89.4 fL (ref 80.0–100.0)
Monocytes Absolute: 0.4 10*3/uL (ref 0.1–1.0)
Monocytes Relative: 8 %
Neutro Abs: 2.1 10*3/uL (ref 1.7–7.7)
Neutrophils Relative %: 42 %
Platelet Count: 259 10*3/uL (ref 150–400)
RBC: 4.34 MIL/uL (ref 3.87–5.11)
RDW: 12.2 % (ref 11.5–15.5)
WBC Count: 4.9 10*3/uL (ref 4.0–10.5)
nRBC: 0 % (ref 0.0–0.2)

## 2020-01-29 LAB — FERRITIN: Ferritin: 263 ng/mL (ref 11–307)

## 2020-01-29 NOTE — Telephone Encounter (Signed)
Sent schedule message to get patients appointment with Santiago Glad NP on 01/31/20 rescheduled due to patient having scheduling conflict. Made Lacie aware.

## 2020-01-31 ENCOUNTER — Inpatient Hospital Stay (HOSPITAL_BASED_OUTPATIENT_CLINIC_OR_DEPARTMENT_OTHER): Payer: Commercial Managed Care - PPO | Admitting: Nurse Practitioner

## 2020-01-31 ENCOUNTER — Encounter: Payer: Self-pay | Admitting: Nurse Practitioner

## 2020-01-31 ENCOUNTER — Inpatient Hospital Stay: Payer: Commercial Managed Care - PPO | Admitting: Nurse Practitioner

## 2020-01-31 DIAGNOSIS — D500 Iron deficiency anemia secondary to blood loss (chronic): Secondary | ICD-10-CM
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
## 2020-02-01 ENCOUNTER — Telehealth: Payer: Self-pay

## 2020-02-01 NOTE — Telephone Encounter (Signed)
-----   Message from Malachy Mood, MD sent at 02/01/2020  7:18 AM EDT ----- Please let pt know her iron level is good, no need iv iron. K slightly low, encourage high K diet and OTC K replacement. Thanks   Malachy Mood  02/01/2020

## 2020-02-01 NOTE — Telephone Encounter (Signed)
I spoke to Ms Bulls and relayed Dr. Latanya Maudlin message.  She verbalized understanding.

## 2020-02-10 ENCOUNTER — Encounter: Payer: Self-pay | Admitting: Nurse Practitioner

## 2020-02-11 ENCOUNTER — Telehealth: Payer: Self-pay

## 2020-02-11 ENCOUNTER — Telehealth: Payer: Self-pay | Admitting: Nurse Practitioner

## 2020-02-11 NOTE — Telephone Encounter (Signed)
Scheduled appt per 6/7 sch message - called pt . No answer left message with appts

## 2020-02-11 NOTE — Telephone Encounter (Signed)
TC to pt to let her know that I received her message and I sent over a schedule message to get her next 3-4 appointments scheduled.

## 2020-05-02 ENCOUNTER — Inpatient Hospital Stay: Payer: Commercial Managed Care - PPO | Attending: Hematology

## 2020-05-02 ENCOUNTER — Other Ambulatory Visit: Payer: Self-pay

## 2020-05-02 DIAGNOSIS — D500 Iron deficiency anemia secondary to blood loss (chronic): Secondary | ICD-10-CM

## 2020-05-02 DIAGNOSIS — D509 Iron deficiency anemia, unspecified: Secondary | ICD-10-CM | POA: Insufficient documentation

## 2020-05-02 LAB — CBC WITH DIFFERENTIAL (CANCER CENTER ONLY)
Abs Immature Granulocytes: 0 10*3/uL (ref 0.00–0.07)
Basophils Absolute: 0 10*3/uL (ref 0.0–0.1)
Basophils Relative: 1 %
Eosinophils Absolute: 0.1 10*3/uL (ref 0.0–0.5)
Eosinophils Relative: 4 %
HCT: 41.3 % (ref 36.0–46.0)
Hemoglobin: 13.3 g/dL (ref 12.0–15.0)
Immature Granulocytes: 0 %
Lymphocytes Relative: 46 %
Lymphs Abs: 1.7 10*3/uL (ref 0.7–4.0)
MCH: 30.2 pg (ref 26.0–34.0)
MCHC: 32.2 g/dL (ref 30.0–36.0)
MCV: 93.9 fL (ref 80.0–100.0)
Monocytes Absolute: 0.3 10*3/uL (ref 0.1–1.0)
Monocytes Relative: 8 %
Neutro Abs: 1.5 10*3/uL — ABNORMAL LOW (ref 1.7–7.7)
Neutrophils Relative %: 41 %
Platelet Count: 223 10*3/uL (ref 150–400)
RBC: 4.4 MIL/uL (ref 3.87–5.11)
RDW: 12.4 % (ref 11.5–15.5)
WBC Count: 3.7 10*3/uL — ABNORMAL LOW (ref 4.0–10.5)
nRBC: 0 % (ref 0.0–0.2)

## 2020-05-02 LAB — CMP (CANCER CENTER ONLY)
ALT: 9 U/L (ref 0–44)
AST: 15 U/L (ref 15–41)
Albumin: 3.6 g/dL (ref 3.5–5.0)
Alkaline Phosphatase: 51 U/L (ref 38–126)
Anion gap: 6 (ref 5–15)
BUN: 8 mg/dL (ref 6–20)
CO2: 27 mmol/L (ref 22–32)
Calcium: 9.4 mg/dL (ref 8.9–10.3)
Chloride: 108 mmol/L (ref 98–111)
Creatinine: 0.82 mg/dL (ref 0.44–1.00)
GFR, Est AFR Am: >60 mL/min (ref >60)
GFR, Estimated: >60 mL/min (ref >60)
Glucose, Bld: 83 mg/dL (ref 70–99)
Potassium: 3.7 mmol/L (ref 3.5–5.1)
Sodium: 141 mmol/L (ref 135–145)
Total Bilirubin: 0.4 mg/dL (ref 0.3–1.2)
Total Protein: 6.8 g/dL (ref 6.5–8.1)

## 2020-05-02 LAB — IRON AND TIBC
Iron: 79 ug/dL (ref 41–142)
Saturation Ratios: 28 % (ref 21–57)
TIBC: 280 ug/dL (ref 236–444)
UIBC: 201 ug/dL (ref 120–384)

## 2020-05-02 LAB — FERRITIN: Ferritin: 221 ng/mL (ref 11–307)

## 2020-05-07 ENCOUNTER — Encounter: Payer: Self-pay | Admitting: Nurse Practitioner

## 2020-08-04 ENCOUNTER — Other Ambulatory Visit: Payer: Self-pay | Admitting: Hematology

## 2020-08-04 ENCOUNTER — Inpatient Hospital Stay: Payer: Commercial Managed Care - PPO | Attending: Hematology

## 2020-08-04 ENCOUNTER — Other Ambulatory Visit: Payer: Self-pay

## 2020-08-04 DIAGNOSIS — D509 Iron deficiency anemia, unspecified: Secondary | ICD-10-CM | POA: Insufficient documentation

## 2020-08-04 DIAGNOSIS — D500 Iron deficiency anemia secondary to blood loss (chronic): Secondary | ICD-10-CM

## 2020-08-04 LAB — CBC WITH DIFFERENTIAL (CANCER CENTER ONLY)
Abs Immature Granulocytes: 0.01 10*3/uL (ref 0.00–0.07)
Basophils Absolute: 0.1 10*3/uL (ref 0.0–0.1)
Basophils Relative: 1 %
Eosinophils Absolute: 0.1 10*3/uL (ref 0.0–0.5)
Eosinophils Relative: 2 %
HCT: 39.1 % (ref 36.0–46.0)
Hemoglobin: 12.9 g/dL (ref 12.0–15.0)
Immature Granulocytes: 0 %
Lymphocytes Relative: 49 %
Lymphs Abs: 2 10*3/uL (ref 0.7–4.0)
MCH: 30.3 pg (ref 26.0–34.0)
MCHC: 33 g/dL (ref 30.0–36.0)
MCV: 91.8 fL (ref 80.0–100.0)
Monocytes Absolute: 0.4 10*3/uL (ref 0.1–1.0)
Monocytes Relative: 8 %
Neutro Abs: 1.7 10*3/uL (ref 1.7–7.7)
Neutrophils Relative %: 40 %
Platelet Count: 246 10*3/uL (ref 150–400)
RBC: 4.26 MIL/uL (ref 3.87–5.11)
RDW: 12.5 % (ref 11.5–15.5)
WBC Count: 4.3 10*3/uL (ref 4.0–10.5)
nRBC: 0 % (ref 0.0–0.2)

## 2020-08-04 LAB — RETIC PANEL
Immature Retic Fract: 7.2 % (ref 2.3–15.9)
RBC.: 4.3 MIL/uL (ref 3.87–5.11)
Retic Count, Absolute: 65.8 10*3/uL (ref 19.0–186.0)
Retic Ct Pct: 1.5 % (ref 0.4–3.1)
Reticulocyte Hemoglobin: 36.3 pg (ref >27.9)

## 2020-08-04 LAB — IRON AND TIBC
Iron: 80 ug/dL (ref 41–142)
Saturation Ratios: 27 % (ref 21–57)
TIBC: 301 ug/dL (ref 236–444)
UIBC: 221 ug/dL (ref 120–384)

## 2020-08-04 LAB — FERRITIN: Ferritin: 148 ng/mL (ref 11–307)

## 2020-11-03 ENCOUNTER — Inpatient Hospital Stay: Payer: Commercial Managed Care - PPO | Attending: Hematology

## 2020-11-03 ENCOUNTER — Other Ambulatory Visit: Payer: Self-pay

## 2020-11-03 DIAGNOSIS — D500 Iron deficiency anemia secondary to blood loss (chronic): Secondary | ICD-10-CM | POA: Diagnosis not present

## 2020-11-03 DIAGNOSIS — N920 Excessive and frequent menstruation with regular cycle: Secondary | ICD-10-CM | POA: Insufficient documentation

## 2020-11-03 LAB — CBC WITH DIFFERENTIAL (CANCER CENTER ONLY)
Abs Immature Granulocytes: 0 10*3/uL (ref 0.00–0.07)
Basophils Absolute: 0.1 10*3/uL (ref 0.0–0.1)
Basophils Relative: 1 %
Eosinophils Absolute: 0.1 10*3/uL (ref 0.0–0.5)
Eosinophils Relative: 4 %
HCT: 41.3 % (ref 36.0–46.0)
Hemoglobin: 13.2 g/dL (ref 12.0–15.0)
Immature Granulocytes: 0 %
Lymphocytes Relative: 42 %
Lymphs Abs: 1.5 10*3/uL (ref 0.7–4.0)
MCH: 30.1 pg (ref 26.0–34.0)
MCHC: 32 g/dL (ref 30.0–36.0)
MCV: 94.3 fL (ref 80.0–100.0)
Monocytes Absolute: 0.3 10*3/uL (ref 0.1–1.0)
Monocytes Relative: 9 %
Neutro Abs: 1.5 10*3/uL — ABNORMAL LOW (ref 1.7–7.7)
Neutrophils Relative %: 44 %
Platelet Count: 221 10*3/uL (ref 150–400)
RBC: 4.38 MIL/uL (ref 3.87–5.11)
RDW: 12.5 % (ref 11.5–15.5)
WBC Count: 3.5 10*3/uL — ABNORMAL LOW (ref 4.0–10.5)
nRBC: 0 % (ref 0.0–0.2)

## 2020-11-03 LAB — RETIC PANEL
Immature Retic Fract: 4.9 % (ref 2.3–15.9)
RBC.: 4.3 MIL/uL (ref 3.87–5.11)
Retic Count, Absolute: 34.8 10*3/uL (ref 19.0–186.0)
Retic Ct Pct: 0.8 % (ref 0.4–3.1)
Reticulocyte Hemoglobin: 35 pg (ref >27.9)

## 2020-11-03 LAB — IRON AND TIBC
Iron: 95 ug/dL (ref 41–142)
Saturation Ratios: 30 % (ref 21–57)
TIBC: 314 ug/dL (ref 236–444)
UIBC: 219 ug/dL (ref 120–384)

## 2020-11-03 LAB — FERRITIN: Ferritin: 160 ng/mL (ref 11–307)
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
## 2020-11-05 ENCOUNTER — Encounter: Payer: Self-pay | Admitting: Hematology

## 2020-11-05 ENCOUNTER — Inpatient Hospital Stay: Payer: Commercial Managed Care - PPO | Attending: Hematology | Admitting: Hematology

## 2020-11-05 ENCOUNTER — Telehealth: Payer: Self-pay | Admitting: Hematology

## 2020-11-05 DIAGNOSIS — D500 Iron deficiency anemia secondary to blood loss (chronic): Secondary | ICD-10-CM

## 2020-11-05 NOTE — Telephone Encounter (Signed)
Scheduled follow-up appointments per 3/2 los. Patient is aware. ?

## 2020-12-25 ENCOUNTER — Other Ambulatory Visit (HOSPITAL_BASED_OUTPATIENT_CLINIC_OR_DEPARTMENT_OTHER)
Admission: RE | Admit: 2020-12-25 | Discharge: 2020-12-25 | Disposition: A | Discharge status: Home / Self Care | Payer: Commercial Managed Care - PPO | Source: Ambulatory Visit | Attending: Obstetrics & Gynecology | Admitting: Obstetrics & Gynecology

## 2020-12-25 ENCOUNTER — Ambulatory Visit (INDEPENDENT_AMBULATORY_CARE_PROVIDER_SITE_OTHER): Payer: Commercial Managed Care - PPO | Admitting: Obstetrics & Gynecology

## 2020-12-25 ENCOUNTER — Other Ambulatory Visit: Payer: Self-pay

## 2020-12-25 ENCOUNTER — Encounter (HOSPITAL_BASED_OUTPATIENT_CLINIC_OR_DEPARTMENT_OTHER): Payer: Self-pay | Admitting: Obstetrics & Gynecology

## 2020-12-25 ENCOUNTER — Other Ambulatory Visit (HOSPITAL_COMMUNITY)
Admission: RE | Admit: 2020-12-25 | Discharge: 2020-12-25 | Disposition: A | Discharge status: Home / Self Care | Payer: Commercial Managed Care - PPO | Source: Ambulatory Visit | Attending: Obstetrics & Gynecology | Admitting: Obstetrics & Gynecology

## 2020-12-25 VITALS — BP 125/78 | HR 59 | Resp 17 | Ht 62.5 in | Wt 181.4 lb

## 2020-12-25 DIAGNOSIS — Z113 Encounter for screening for infections with a predominantly sexual mode of transmission: Secondary | ICD-10-CM | POA: Insufficient documentation

## 2020-12-25 DIAGNOSIS — Z01419 Encounter for gynecological examination (general) (routine) without abnormal findings: Secondary | ICD-10-CM | POA: Diagnosis not present

## 2020-12-25 DIAGNOSIS — Z0000 Encounter for general adult medical examination without abnormal findings: Secondary | ICD-10-CM

## 2020-12-25 DIAGNOSIS — E611 Iron deficiency: Secondary | ICD-10-CM | POA: Insufficient documentation

## 2020-12-25 DIAGNOSIS — L732 Hidradenitis suppurativa: Secondary | ICD-10-CM

## 2020-12-25 LAB — COMPREHENSIVE METABOLIC PANEL
ALT: 11 U/L (ref 0–44)
AST: 14 U/L — ABNORMAL LOW (ref 15–41)
Albumin: 4.1 g/dL (ref 3.5–5.0)
Alkaline Phosphatase: 44 U/L (ref 38–126)
Anion gap: 8 (ref 5–15)
BUN: 10 mg/dL (ref 6–20)
CO2: 27 mmol/L (ref 22–32)
Calcium: 8.9 mg/dL (ref 8.9–10.3)
Chloride: 104 mmol/L (ref 98–111)
Creatinine, Ser: 0.71 mg/dL (ref 0.44–1.00)
GFR, Estimated: >60 mL/min (ref >60)
Glucose, Bld: 86 mg/dL (ref 70–99)
Potassium: 3.4 mmol/L — ABNORMAL LOW (ref 3.5–5.1)
Sodium: 139 mmol/L (ref 135–145)
Total Bilirubin: 0.4 mg/dL (ref 0.3–1.2)
Total Protein: 6.8 g/dL (ref 6.5–8.1)

## 2020-12-25 LAB — CBC WITH DIFFERENTIAL/PLATELET
Abs Immature Granulocytes: 0 10*3/uL (ref 0.00–0.07)
Basophils Absolute: 0 10*3/uL (ref 0.0–0.1)
Basophils Relative: 1 %
Eosinophils Absolute: 0.2 10*3/uL (ref 0.0–0.5)
Eosinophils Relative: 4 %
HCT: 39.7 % (ref 36.0–46.0)
Hemoglobin: 13.1 g/dL (ref 12.0–15.0)
Immature Granulocytes: 0 %
Lymphocytes Relative: 46 %
Lymphs Abs: 2 10*3/uL (ref 0.7–4.0)
MCH: 31 pg (ref 26.0–34.0)
MCHC: 33 g/dL (ref 30.0–36.0)
MCV: 93.9 fL (ref 80.0–100.0)
Monocytes Absolute: 0.4 10*3/uL (ref 0.1–1.0)
Monocytes Relative: 9 %
Neutro Abs: 1.7 10*3/uL (ref 1.7–7.7)
Neutrophils Relative %: 40 %
Platelets: 267 10*3/uL (ref 150–400)
RBC: 4.23 MIL/uL (ref 3.87–5.11)
RDW: 13.2 % (ref 11.5–15.5)
WBC: 4.2 10*3/uL (ref 4.0–10.5)
nRBC: 0 % (ref 0.0–0.2)

## 2020-12-25 LAB — LIPID PANEL
Cholesterol: 251 mg/dL — ABNORMAL HIGH (ref 0–200)
HDL: 115 mg/dL (ref >40)
LDL Cholesterol: 127 mg/dL — ABNORMAL HIGH (ref 0–99)
Total CHOL/HDL Ratio: 2.2 RATIO
Triglycerides: 46 mg/dL (ref <150)
VLDL: 9 mg/dL (ref 0–40)

## 2020-12-25 LAB — HIV ANTIBODY (ROUTINE TESTING W REFLEX): HIV Screen 4th Generation wRfx: NONREACTIVE

## 2020-12-25 LAB — HEPATITIS C ANTIBODY: HCV Ab: NONREACTIVE

## 2020-12-25 LAB — FERRITIN: Ferritin: 138 ng/mL (ref 11–307)

## 2020-12-25 LAB — HEPATITIS B SURFACE ANTIGEN: Hepatitis B Surface Ag: NONREACTIVE

## 2020-12-25 LAB — TSH: TSH: 2.413 u[IU]/mL (ref 0.350–4.500)

## 2020-12-25 LAB — IRON: Iron: 40 ug/dL (ref 28–170)

## 2020-12-25 LAB — VITAMIN D 25 HYDROXY (VIT D DEFICIENCY, FRACTURES): Vit D, 25-Hydroxy: 16.58 ng/mL — ABNORMAL LOW (ref 30–100)
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
## 2020-12-26 LAB — CERVICOVAGINAL ANCILLARY ONLY
Chlamydia: NEGATIVE
Comment: NEGATIVE
Comment: NEGATIVE
Comment: NORMAL
Neisseria Gonorrhea: NEGATIVE
Trichomonas: NEGATIVE

## 2020-12-26 LAB — RPR: RPR Ser Ql: NONREACTIVE

## 2020-12-29 ENCOUNTER — Telehealth (HOSPITAL_BASED_OUTPATIENT_CLINIC_OR_DEPARTMENT_OTHER): Payer: Self-pay | Admitting: Obstetrics & Gynecology

## 2020-12-29 NOTE — Telephone Encounter (Signed)
Called patient and left a message to call the office back to scheduled to her annual appointment with Dr.Miller.

## 2021-01-02 ENCOUNTER — Ambulatory Visit: Payer: Commercial Managed Care - PPO

## 2021-02-16 ENCOUNTER — Telehealth: Payer: Self-pay | Admitting: Hematology

## 2021-02-16 NOTE — Telephone Encounter (Signed)
Left message with rescheduled upcoming appointment due to provider's overbooked schedule. Gave option to call back to reschedule if needed.

## 2021-03-02 ENCOUNTER — Inpatient Hospital Stay: Payer: Commercial Managed Care - PPO | Attending: Hematology

## 2021-03-02 ENCOUNTER — Ambulatory Visit: Payer: Commercial Managed Care - PPO | Admitting: Hematology

## 2021-03-02 ENCOUNTER — Other Ambulatory Visit: Payer: Self-pay

## 2021-03-02 DIAGNOSIS — Z79899 Other long term (current) drug therapy: Secondary | ICD-10-CM | POA: Insufficient documentation

## 2021-03-02 DIAGNOSIS — N920 Excessive and frequent menstruation with regular cycle: Secondary | ICD-10-CM | POA: Diagnosis present

## 2021-03-02 DIAGNOSIS — D500 Iron deficiency anemia secondary to blood loss (chronic): Secondary | ICD-10-CM | POA: Insufficient documentation

## 2021-03-02 LAB — CBC WITH DIFFERENTIAL (CANCER CENTER ONLY)
Abs Immature Granulocytes: 0.02 10*3/uL (ref 0.00–0.07)
Basophils Absolute: 0 10*3/uL (ref 0.0–0.1)
Basophils Relative: 1 %
Eosinophils Absolute: 0.1 10*3/uL (ref 0.0–0.5)
Eosinophils Relative: 4 %
HCT: 36.2 % (ref 36.0–46.0)
Hemoglobin: 11.8 g/dL — ABNORMAL LOW (ref 12.0–15.0)
Immature Granulocytes: 1 %
Lymphocytes Relative: 38 %
Lymphs Abs: 1.4 10*3/uL (ref 0.7–4.0)
MCH: 30 pg (ref 26.0–34.0)
MCHC: 32.6 g/dL (ref 30.0–36.0)
MCV: 92.1 fL (ref 80.0–100.0)
Monocytes Absolute: 0.3 10*3/uL (ref 0.1–1.0)
Monocytes Relative: 9 %
Neutro Abs: 1.8 10*3/uL (ref 1.7–7.7)
Neutrophils Relative %: 47 %
Platelet Count: 235 10*3/uL (ref 150–400)
RBC: 3.93 MIL/uL (ref 3.87–5.11)
RDW: 12.6 % (ref 11.5–15.5)
WBC Count: 3.7 10*3/uL — ABNORMAL LOW (ref 4.0–10.5)
nRBC: 0 % (ref 0.0–0.2)

## 2021-03-02 LAB — IRON AND TIBC
Iron: 43 ug/dL (ref 41–142)
Saturation Ratios: 15 % — ABNORMAL LOW (ref 21–57)
TIBC: 283 ug/dL (ref 236–444)
UIBC: 239 ug/dL (ref 120–384)

## 2021-03-02 LAB — RETIC PANEL
Immature Retic Fract: 9.7 % (ref 2.3–15.9)
RBC.: 3.95 MIL/uL (ref 3.87–5.11)
Retic Count, Absolute: 48.2 10*3/uL (ref 19.0–186.0)
Retic Ct Pct: 1.2 % (ref 0.4–3.1)
Reticulocyte Hemoglobin: 32.9 pg (ref >27.9)

## 2021-03-02 LAB — FERRITIN: Ferritin: 83 ng/mL (ref 11–307)

## 2021-03-03 ENCOUNTER — Encounter (HOSPITAL_BASED_OUTPATIENT_CLINIC_OR_DEPARTMENT_OTHER): Payer: Self-pay
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
## 2021-03-04 ENCOUNTER — Inpatient Hospital Stay (HOSPITAL_BASED_OUTPATIENT_CLINIC_OR_DEPARTMENT_OTHER): Payer: Commercial Managed Care - PPO | Admitting: Nurse Practitioner

## 2021-03-04 ENCOUNTER — Encounter: Payer: Self-pay | Admitting: Nurse Practitioner

## 2021-03-04 ENCOUNTER — Telehealth: Payer: Self-pay | Admitting: Hematology

## 2021-03-04 ENCOUNTER — Ambulatory Visit: Payer: Commercial Managed Care - PPO | Admitting: Hematology

## 2021-03-04 DIAGNOSIS — D500 Iron deficiency anemia secondary to blood loss (chronic): Secondary | ICD-10-CM

## 2021-03-04 NOTE — Telephone Encounter (Signed)
Scheduled appts per 6/29 sch msg. Pt aware.  

## 2021-03-05 ENCOUNTER — Telehealth: Payer: Self-pay | Admitting: Hematology

## 2021-03-05 NOTE — Telephone Encounter (Signed)
R/s appt per 6/30 sch msg. Pt aware.  

## 2021-03-12 ENCOUNTER — Ambulatory Visit: Payer: Commercial Managed Care - PPO

## 2021-03-19 ENCOUNTER — Inpatient Hospital Stay: Payer: Commercial Managed Care - PPO | Attending: Hematology

## 2021-03-19 ENCOUNTER — Ambulatory Visit: Payer: Commercial Managed Care - PPO

## 2021-03-19 ENCOUNTER — Other Ambulatory Visit: Payer: Self-pay

## 2021-03-19 VITALS — BP 119/68 | HR 69 | Temp 98.5°F | Resp 16

## 2021-03-19 DIAGNOSIS — N920 Excessive and frequent menstruation with regular cycle: Secondary | ICD-10-CM | POA: Insufficient documentation

## 2021-03-19 DIAGNOSIS — D500 Iron deficiency anemia secondary to blood loss (chronic): Secondary | ICD-10-CM | POA: Insufficient documentation

## 2021-03-19 DIAGNOSIS — Z79899 Other long term (current) drug therapy: Secondary | ICD-10-CM | POA: Insufficient documentation

## 2021-03-19 MED ORDER — SODIUM CHLORIDE 0.9 % IV SOLN
510.0000 mg | Freq: Once | INTRAVENOUS | Status: AC
  Administered 2021-03-19: 510 mg via INTRAVENOUS
  Filled 2021-03-19: qty 510

## 2021-03-19 NOTE — Progress Notes (Signed)
Patient was observed for 30 minutes post iron infusion with no adverse reactions. Upon discharge, vitals stable and patient with no complaints.

## 2021-03-19 NOTE — Patient Instructions (Signed)
Ferumoxytol injection What is this medicine? FERUMOXYTOL is an iron complex. Iron is used to make healthy red blood cells, which carry oxygen and nutrients throughout the body. This medicine is used to treat iron deficiency anemia. This medicine may be used for other purposes; ask your health care provider or pharmacist if you have questions. COMMON BRAND NAME(S): Feraheme What should I tell my health care provider before I take this medicine? They need to know if you have any of these conditions: anemia not caused by low iron levels high levels of iron in the blood magnetic resonance imaging (MRI) test scheduled an unusual or allergic reaction to iron, other medicines, foods, dyes, or preservatives pregnant or trying to get pregnant breast-feeding How should I use this medicine? This medicine is for injection into a vein. It is given by a health care professional in a hospital or clinic setting. Talk to your pediatrician regarding the use of this medicine in children. Special care may be needed. Overdosage: If you think you have taken too much of this medicine contact a poison control center or emergency room at once. NOTE: This medicine is only for you. Do not share this medicine with others. What if I miss a dose? It is important not to miss your dose. Call your doctor or health care professional if you are unable to keep an appointment. What may interact with this medicine? This medicine may interact with the following medications: other iron products This list may not describe all possible interactions. Give your health care provider a list of all the medicines, herbs, non-prescription drugs, or dietary supplements you use. Also tell them if you smoke, drink alcohol, or use illegal drugs. Some items may interact with your medicine. What should I watch for while using this medicine? Visit your doctor or healthcare professional regularly. Tell your doctor or healthcare professional if  your symptoms do not start to get better or if they get worse. You may need blood work done while you are taking this medicine. You may need to follow a special diet. Talk to your doctor. Foods that contain iron include: whole grains/cereals, dried fruits, beans, or peas, leafy green vegetables, and organ meats (liver, kidney). What side effects may I notice from receiving this medicine? Side effects that you should report to your doctor or health care professional as soon as possible: allergic reactions like skin rash, itching or hives, swelling of the face, lips, or tongue breathing problems changes in blood pressure feeling faint or lightheaded, falls fever or chills flushing, sweating, or hot feelings swelling of the ankles or feet Side effects that usually do not require medical attention (report to your doctor or health care professional if they continue or are bothersome): diarrhea headache nausea, vomiting stomach pain This list may not describe all possible side effects. Call your doctor for medical advice about side effects. You may report side effects to FDA at 1-800-FDA-1088. Where should I keep my medicine? This drug is given in a hospital or clinic and will not be stored at home. NOTE: This sheet is a summary. It may not cover all possible information. If you have questions about this medicine, talk to your doctor, pharmacist, or health care provider.  2020 Elsevier/Gold Standard (2016-10-11 20:21:10) Coronavirus (COVID-19) Are you at risk?  Are you at risk for the Coronavirus (COVID-19)?  To be considered HIGH RISK for Coronavirus (COVID-19), you have to meet the following criteria:  Traveled to [place], [place], [place], [place] or [place]; or  in the [place] to [place], [place], [place], or [place]; and have fever, cough, and shortness of breath within the last 2 weeks of travel OR Been in close contact with a person diagnosed with COVID-19 within the last 2  weeks and have fever, cough, and shortness of breath IF YOU DO NOT MEET THESE CRITERIA, YOU ARE CONSIDERED LOW RISK FOR COVID-19.  What to do if you are HIGH RISK for COVID-19?  If you are having a medical emergency, call 911. Seek medical care right away. Before you go to a doctor's office, urgent care or emergency department, call ahead and tell them about your recent travel, contact with someone diagnosed with COVID-19, and your symptoms. You should receive instructions from your physician's office regarding next steps of care.  When you arrive at healthcare provider, tell the healthcare staff immediately you have returned from visiting [place], [place], [place], [place] or [place]; or traveled in the [place] to [place], [place], [place], or [place]; in the last two weeks or you have been in close contact with a person diagnosed with COVID-19 in the last 2 weeks.   Tell the health care staff about your symptoms: fever, cough and shortness of breath. After you have been seen by a medical provider, you will be either: Tested for (COVID-19) and discharged home on quarantine except to seek medical care if symptoms worsen, and asked to  Stay home and avoid contact with others until you get your results (4-5 days)  Avoid travel on public transportation if possible (such as bus, train, or airplane) or Sent to the Emergency Department by EMS for evaluation, COVID-19 testing, and possible admission depending on your condition and test results.  What to do if you are LOW RISK for COVID-19?  Reduce your risk of any infection by using the same precautions used for avoiding the common cold or flu:  Wash your hands often with soap and warm water for at least 20 seconds.  If soap and water are not readily available, use an alcohol-based hand sanitizer with at least 60% alcohol.  If coughing or sneezing, cover your mouth and nose by coughing or sneezing into the elbow areas of your shirt or coat,  into a tissue or into your sleeve (not your hands). Avoid shaking hands with others and consider head nods or verbal greetings only. Avoid touching your eyes, nose, or mouth with unwashed hands.  Avoid close contact with people who are sick. Avoid places or events with large numbers of people in one location, like concerts or sporting events. Carefully consider travel plans you have or are making. If you are planning any travel outside or inside the [place], visit the CDC's Travelers' Health webpage for the latest health notices. If you have some symptoms but not all symptoms, continue to monitor at home and seek medical attention if your symptoms worsen. If you are having a medical emergency, call 911.   ADDITIONAL HEALTHCARE OPTIONS FOR PATIENTS  [facility] / e-Visit: [email or web address]         [facility]: [phone]  [facility]: [phone]                   [facility]: [phone]

## 2021-04-27 ENCOUNTER — Encounter (HOSPITAL_BASED_OUTPATIENT_CLINIC_OR_DEPARTMENT_OTHER): Payer: Self-pay | Admitting: Obstetrics & Gynecology

## 2021-05-06 ENCOUNTER — Encounter (HOSPITAL_BASED_OUTPATIENT_CLINIC_OR_DEPARTMENT_OTHER): Payer: Self-pay

## 2021-06-01 ENCOUNTER — Other Ambulatory Visit: Payer: Self-pay

## 2021-06-01 ENCOUNTER — Inpatient Hospital Stay: Payer: Commercial Managed Care - PPO | Attending: Hematology

## 2021-06-01 DIAGNOSIS — N920 Excessive and frequent menstruation with regular cycle: Secondary | ICD-10-CM | POA: Insufficient documentation

## 2021-06-01 DIAGNOSIS — D500 Iron deficiency anemia secondary to blood loss (chronic): Secondary | ICD-10-CM | POA: Insufficient documentation

## 2021-06-01 LAB — CBC WITH DIFFERENTIAL (CANCER CENTER ONLY)
Abs Immature Granulocytes: 0.01 10*3/uL (ref 0.00–0.07)
Basophils Absolute: 0 10*3/uL (ref 0.0–0.1)
Basophils Relative: 1 %
Eosinophils Absolute: 0.2 10*3/uL (ref 0.0–0.5)
Eosinophils Relative: 4 %
HCT: 39.6 % (ref 36.0–46.0)
Hemoglobin: 13.2 g/dL (ref 12.0–15.0)
Immature Granulocytes: 0 %
Lymphocytes Relative: 44 %
Lymphs Abs: 1.8 10*3/uL (ref 0.7–4.0)
MCH: 30 pg (ref 26.0–34.0)
MCHC: 33.3 g/dL (ref 30.0–36.0)
MCV: 90 fL (ref 80.0–100.0)
Monocytes Absolute: 0.3 10*3/uL (ref 0.1–1.0)
Monocytes Relative: 8 %
Neutro Abs: 1.7 10*3/uL (ref 1.7–7.7)
Neutrophils Relative %: 43 %
Platelet Count: 221 10*3/uL (ref 150–400)
RBC: 4.4 MIL/uL (ref 3.87–5.11)
RDW: 13.3 % (ref 11.5–15.5)
WBC Count: 4.1 10*3/uL (ref 4.0–10.5)
nRBC: 0 % (ref 0.0–0.2)

## 2021-06-01 LAB — IRON AND TIBC
Iron: 64 ug/dL (ref 41–142)
Saturation Ratios: 25 % (ref 21–57)
TIBC: 260 ug/dL (ref 236–444)
UIBC: 196 ug/dL (ref 120–384)

## 2021-06-01 LAB — FERRITIN: Ferritin: 220 ng/mL (ref 11–307)

## 2021-06-01 LAB — RETIC PANEL
Immature Retic Fract: 5.9 % (ref 2.3–15.9)
RBC.: 4.45 MIL/uL (ref 3.87–5.11)
Retic Count, Absolute: 52.5 10*3/uL (ref 19.0–186.0)
Retic Ct Pct: 1.2 % (ref 0.4–3.1)
Reticulocyte Hemoglobin: 34.2 pg (ref >27.9)
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
## 2021-06-03 ENCOUNTER — Inpatient Hospital Stay (HOSPITAL_BASED_OUTPATIENT_CLINIC_OR_DEPARTMENT_OTHER): Payer: Commercial Managed Care - PPO | Admitting: Hematology

## 2021-06-03 ENCOUNTER — Encounter: Payer: Self-pay | Admitting: Hematology

## 2021-06-03 DIAGNOSIS — D500 Iron deficiency anemia secondary to blood loss (chronic): Secondary | ICD-10-CM | POA: Diagnosis not present

## 2021-06-09 ENCOUNTER — Telehealth: Payer: Self-pay | Admitting: Hematology

## 2021-06-09 NOTE — Telephone Encounter (Signed)
Scheduled follow-up appointments per 9/28 los. Patient is aware. 

## 2021-09-01 ENCOUNTER — Other Ambulatory Visit: Payer: Commercial Managed Care - PPO

## 2021-09-03 ENCOUNTER — Telehealth: Payer: Commercial Managed Care - PPO | Admitting: Hematology

## 2021-09-21 ENCOUNTER — Encounter: Payer: Self-pay | Admitting: Hematology and Oncology

## 2021-09-25 ENCOUNTER — Other Ambulatory Visit: Payer: Self-pay

## 2021-09-25 DIAGNOSIS — D500 Iron deficiency anemia secondary to blood loss (chronic): Secondary | ICD-10-CM

## 2021-09-28 ENCOUNTER — Other Ambulatory Visit: Payer: Self-pay

## 2021-09-28 ENCOUNTER — Encounter: Payer: Self-pay | Admitting: Hematology and Oncology

## 2021-09-28 ENCOUNTER — Inpatient Hospital Stay: Payer: BC Managed Care – PPO | Attending: Hematology

## 2021-09-28 DIAGNOSIS — D500 Iron deficiency anemia secondary to blood loss (chronic): Secondary | ICD-10-CM

## 2021-09-28 DIAGNOSIS — D509 Iron deficiency anemia, unspecified: Secondary | ICD-10-CM | POA: Insufficient documentation

## 2021-09-28 LAB — CBC WITH DIFFERENTIAL/PLATELET
Abs Immature Granulocytes: 0.01 10*3/uL (ref 0.00–0.07)
Basophils Absolute: 0.1 10*3/uL (ref 0.0–0.1)
Basophils Relative: 1 %
Eosinophils Absolute: 0.1 10*3/uL (ref 0.0–0.5)
Eosinophils Relative: 2 %
HCT: 39.6 % (ref 36.0–46.0)
Hemoglobin: 12.9 g/dL (ref 12.0–15.0)
Immature Granulocytes: 0 %
Lymphocytes Relative: 48 %
Lymphs Abs: 1.8 10*3/uL (ref 0.7–4.0)
MCH: 29.8 pg (ref 26.0–34.0)
MCHC: 32.6 g/dL (ref 30.0–36.0)
MCV: 91.5 fL (ref 80.0–100.0)
Monocytes Absolute: 0.2 10*3/uL (ref 0.1–1.0)
Monocytes Relative: 6 %
Neutro Abs: 1.7 10*3/uL (ref 1.7–7.7)
Neutrophils Relative %: 43 %
Platelets: 218 10*3/uL (ref 150–400)
RBC: 4.33 MIL/uL (ref 3.87–5.11)
RDW: 12 % (ref 11.5–15.5)
WBC: 3.9 10*3/uL — ABNORMAL LOW (ref 4.0–10.5)
nRBC: 0 % (ref 0.0–0.2)

## 2021-09-28 LAB — IRON AND IRON BINDING CAPACITY (CC-WL,HP ONLY)
Iron: 94 ug/dL (ref 28–170)
Saturation Ratios: 33 % — ABNORMAL HIGH (ref 10.4–31.8)
TIBC: 284 ug/dL (ref 250–450)
UIBC: 190 ug/dL (ref 148–442)

## 2021-09-28 LAB — RETIC PANEL
Immature Retic Fract: 6.9 % (ref 2.3–15.9)
RBC.: 4.17 MIL/uL (ref 3.87–5.11)
Retic Count, Absolute: 32.5 10*3/uL (ref 19.0–186.0)
Retic Ct Pct: 0.8 % (ref 0.4–3.1)
Reticulocyte Hemoglobin: 34.3 pg (ref >27.9)

## 2021-09-28 LAB — FERRITIN: Ferritin: 235 ng/mL (ref 11–307)
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
## 2021-09-30 ENCOUNTER — Inpatient Hospital Stay (HOSPITAL_BASED_OUTPATIENT_CLINIC_OR_DEPARTMENT_OTHER): Payer: BC Managed Care – PPO | Admitting: Hematology

## 2021-09-30 ENCOUNTER — Encounter: Payer: Self-pay | Admitting: Hematology

## 2021-09-30 DIAGNOSIS — D500 Iron deficiency anemia secondary to blood loss (chronic): Secondary | ICD-10-CM | POA: Diagnosis not present

## 2021-10-21 ENCOUNTER — Encounter: Payer: Self-pay | Admitting: Hematology and Oncology

## 2021-10-22 DIAGNOSIS — Z79899 Other long term (current) drug therapy: Secondary | ICD-10-CM | POA: Diagnosis not present

## 2021-10-22 DIAGNOSIS — Z113 Encounter for screening for infections with a predominantly sexual mode of transmission: Secondary | ICD-10-CM | POA: Diagnosis not present

## 2021-10-22 DIAGNOSIS — D72819 Decreased white blood cell count, unspecified: Secondary | ICD-10-CM | POA: Diagnosis not present

## 2021-10-22 DIAGNOSIS — Z1159 Encounter for screening for other viral diseases: Secondary | ICD-10-CM | POA: Diagnosis not present

## 2021-10-22 DIAGNOSIS — E782 Mixed hyperlipidemia: Secondary | ICD-10-CM | POA: Diagnosis not present

## 2021-10-22 DIAGNOSIS — Z0000 Encounter for general adult medical examination without abnormal findings: Secondary | ICD-10-CM | POA: Diagnosis not present

## 2021-10-23 ENCOUNTER — Encounter: Payer: Self-pay | Admitting: Neurology

## 2021-11-19 ENCOUNTER — Encounter (HOSPITAL_BASED_OUTPATIENT_CLINIC_OR_DEPARTMENT_OTHER): Payer: Self-pay | Admitting: Obstetrics & Gynecology

## 2021-11-19 ENCOUNTER — Ambulatory Visit (INDEPENDENT_AMBULATORY_CARE_PROVIDER_SITE_OTHER): Payer: BC Managed Care – PPO | Admitting: Obstetrics & Gynecology

## 2021-11-19 ENCOUNTER — Other Ambulatory Visit: Payer: Self-pay

## 2021-11-19 ENCOUNTER — Other Ambulatory Visit (HOSPITAL_COMMUNITY)
Admission: RE | Admit: 2021-11-19 | Discharge: 2021-11-19 | Disposition: A | Discharge status: Home / Self Care | Payer: BC Managed Care – PPO | Source: Ambulatory Visit | Attending: Obstetrics & Gynecology | Admitting: Obstetrics & Gynecology

## 2021-11-19 VITALS — BP 128/89 | HR 67 | Ht 62.5 in | Wt 177.8 lb

## 2021-11-19 DIAGNOSIS — R109 Unspecified abdominal pain: Secondary | ICD-10-CM | POA: Diagnosis not present

## 2021-11-19 DIAGNOSIS — R600 Localized edema: Secondary | ICD-10-CM | POA: Diagnosis not present

## 2021-11-19 DIAGNOSIS — Z113 Encounter for screening for infections with a predominantly sexual mode of transmission: Secondary | ICD-10-CM

## 2021-11-19 MED ORDER — IBUPROFEN 800 MG PO TABS: 800.0000 mg | ORAL_TABLET | Freq: Three times a day (TID) | ORAL | 1 refills | Status: DC | PRN

## 2021-11-19 MED ORDER — CYCLOBENZAPRINE HCL 5 MG PO TABS: 5.0000 mg | ORAL_TABLET | Freq: Every day | ORAL | 0 refills | Status: DC

## 2021-11-19 NOTE — Patient Instructions (Signed)
[facility] ?[address] ?[place], [place] [address] ? ?[phone] ?
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
## 2021-11-20 LAB — CERVICOVAGINAL ANCILLARY ONLY
Chlamydia: NEGATIVE
Comment: NEGATIVE
Comment: NEGATIVE
Comment: NORMAL
Neisseria Gonorrhea: NEGATIVE
Trichomonas: NEGATIVE

## 2021-11-23 ENCOUNTER — Encounter (HOSPITAL_BASED_OUTPATIENT_CLINIC_OR_DEPARTMENT_OTHER): Payer: Self-pay | Admitting: Obstetrics & Gynecology

## 2021-11-23 ENCOUNTER — Other Ambulatory Visit (HOSPITAL_BASED_OUTPATIENT_CLINIC_OR_DEPARTMENT_OTHER): Payer: Self-pay | Admitting: Obstetrics & Gynecology

## 2021-11-23 DIAGNOSIS — R600 Localized edema: Secondary | ICD-10-CM

## 2021-11-23 NOTE — Progress Notes (Signed)
Referral to [facility] placed ?

## 2021-11-30 ENCOUNTER — Other Ambulatory Visit: Payer: Self-pay

## 2021-11-30 ENCOUNTER — Ambulatory Visit: Payer: BC Managed Care – PPO | Attending: Nurse Practitioner

## 2021-11-30 DIAGNOSIS — M62838 Other muscle spasm: Secondary | ICD-10-CM | POA: Diagnosis not present

## 2021-11-30 DIAGNOSIS — R279 Unspecified lack of coordination: Secondary | ICD-10-CM | POA: Insufficient documentation

## 2021-11-30 DIAGNOSIS — M6281 Muscle weakness (generalized): Secondary | ICD-10-CM | POA: Diagnosis not present
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
## 2021-11-30 NOTE — Patient Instructions (Signed)
Scar tissue mobilization: ?The first phase of working on scar tissue is without lotion or oil. ?You will develop a feel for where your scar is restricted, or not moving as ?much as the skin around it. These are the areas you want to focus on. ?Begin by placing fingers on scar tissue; gently press the skin gently in one ?direction. Do not let your fingers slide over the skin, but pull any adhesions ?under it. Maintain pressure for several seconds. ?Perform this technique in different directions over each restricted spot you ?find. ?Pressure does not need to be intense, but only comfortable. ?Perform for 5-10 minutes daily. ? ?Scar tissue desensitization: ?In order to decrease the sensitivity of scar tissue, use oil or lotion (perform ?after scar tissue mobilization). ?Very gently massage over and around scar, not increasing discomfort at all. ?Perform for several minutes daily. ? ?

## 2021-12-14 ENCOUNTER — Ambulatory Visit: Payer: BC Managed Care – PPO | Attending: Nurse Practitioner

## 2021-12-14 DIAGNOSIS — M62838 Other muscle spasm: Secondary | ICD-10-CM | POA: Diagnosis not present

## 2021-12-14 DIAGNOSIS — M6281 Muscle weakness (generalized): Secondary | ICD-10-CM | POA: Diagnosis not present

## 2021-12-14 DIAGNOSIS — R279 Unspecified lack of coordination: Secondary | ICD-10-CM | POA: Diagnosis not present
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
## 2021-12-17 ENCOUNTER — Ambulatory Visit: Payer: BC Managed Care – PPO

## 2021-12-17 DIAGNOSIS — M62838 Other muscle spasm: Secondary | ICD-10-CM

## 2021-12-17 DIAGNOSIS — R279 Unspecified lack of coordination: Secondary | ICD-10-CM

## 2021-12-17 DIAGNOSIS — M6281 Muscle weakness (generalized): Secondary | ICD-10-CM
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
## 2021-12-22 ENCOUNTER — Ambulatory Visit: Payer: BC Managed Care – PPO

## 2021-12-24 ENCOUNTER — Ambulatory Visit: Payer: BC Managed Care – PPO

## 2021-12-24 DIAGNOSIS — M6281 Muscle weakness (generalized): Secondary | ICD-10-CM

## 2021-12-24 DIAGNOSIS — M62838 Other muscle spasm: Secondary | ICD-10-CM | POA: Diagnosis not present

## 2021-12-24 DIAGNOSIS — R279 Unspecified lack of coordination: Secondary | ICD-10-CM
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
## 2021-12-28 ENCOUNTER — Ambulatory Visit: Payer: BC Managed Care – PPO

## 2021-12-28 ENCOUNTER — Encounter (HOSPITAL_BASED_OUTPATIENT_CLINIC_OR_DEPARTMENT_OTHER): Payer: Self-pay

## 2021-12-28 ENCOUNTER — Ambulatory Visit (HOSPITAL_BASED_OUTPATIENT_CLINIC_OR_DEPARTMENT_OTHER): Payer: BC Managed Care – PPO | Admitting: Obstetrics & Gynecology

## 2021-12-28 ENCOUNTER — Encounter (HOSPITAL_BASED_OUTPATIENT_CLINIC_OR_DEPARTMENT_OTHER): Payer: Self-pay | Admitting: Obstetrics & Gynecology

## 2021-12-28 DIAGNOSIS — M62838 Other muscle spasm: Secondary | ICD-10-CM | POA: Diagnosis not present

## 2021-12-28 DIAGNOSIS — M6281 Muscle weakness (generalized): Secondary | ICD-10-CM

## 2021-12-28 DIAGNOSIS — R279 Unspecified lack of coordination: Secondary | ICD-10-CM | POA: Diagnosis not present
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
## 2021-12-31 ENCOUNTER — Ambulatory Visit: Payer: BC Managed Care – PPO

## 2021-12-31 DIAGNOSIS — M62838 Other muscle spasm: Secondary | ICD-10-CM

## 2021-12-31 DIAGNOSIS — R279 Unspecified lack of coordination: Secondary | ICD-10-CM | POA: Diagnosis not present

## 2021-12-31 DIAGNOSIS — M6281 Muscle weakness (generalized): Secondary | ICD-10-CM
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
## 2022-01-05 ENCOUNTER — Other Ambulatory Visit: Payer: Self-pay

## 2022-01-05 ENCOUNTER — Encounter (HOSPITAL_BASED_OUTPATIENT_CLINIC_OR_DEPARTMENT_OTHER): Payer: Self-pay | Admitting: Obstetrics & Gynecology

## 2022-01-05 ENCOUNTER — Ambulatory Visit (INDEPENDENT_AMBULATORY_CARE_PROVIDER_SITE_OTHER): Payer: BC Managed Care – PPO | Admitting: Obstetrics & Gynecology

## 2022-01-05 ENCOUNTER — Other Ambulatory Visit (HOSPITAL_COMMUNITY)
Admission: RE | Admit: 2022-01-05 | Discharge: 2022-01-05 | Disposition: A | Discharge status: Home / Self Care | Payer: BC Managed Care – PPO | Source: Ambulatory Visit | Attending: Obstetrics & Gynecology | Admitting: Obstetrics & Gynecology

## 2022-01-05 VITALS — BP 120/60 | HR 65 | Ht 62.5 in | Wt 182.2 lb

## 2022-01-05 DIAGNOSIS — R87610 Atypical squamous cells of undetermined significance on cytologic smear of cervix (ASC-US): Secondary | ICD-10-CM | POA: Insufficient documentation

## 2022-01-05 DIAGNOSIS — Z01419 Encounter for gynecological examination (general) (routine) without abnormal findings: Secondary | ICD-10-CM

## 2022-01-05 DIAGNOSIS — Z124 Encounter for screening for malignant neoplasm of cervix: Secondary | ICD-10-CM | POA: Diagnosis not present

## 2022-01-05 DIAGNOSIS — I872 Venous insufficiency (chronic) (peripheral): Secondary | ICD-10-CM

## 2022-01-05 DIAGNOSIS — M170 Bilateral primary osteoarthritis of knee: Secondary | ICD-10-CM | POA: Diagnosis not present

## 2022-01-05 DIAGNOSIS — Z780 Asymptomatic menopausal state: Secondary | ICD-10-CM

## 2022-01-05 DIAGNOSIS — E611 Iron deficiency: Secondary | ICD-10-CM
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
## 2022-01-06 ENCOUNTER — Ambulatory Visit: Payer: BC Managed Care – PPO | Attending: Nurse Practitioner

## 2022-01-06 DIAGNOSIS — M62838 Other muscle spasm: Secondary | ICD-10-CM | POA: Insufficient documentation

## 2022-01-06 DIAGNOSIS — R279 Unspecified lack of coordination: Secondary | ICD-10-CM | POA: Insufficient documentation

## 2022-01-06 DIAGNOSIS — M6281 Muscle weakness (generalized): Secondary | ICD-10-CM | POA: Insufficient documentation
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
## 2022-01-07 ENCOUNTER — Ambulatory Visit: Payer: BC Managed Care – PPO

## 2022-01-07 DIAGNOSIS — R279 Unspecified lack of coordination: Secondary | ICD-10-CM

## 2022-01-07 DIAGNOSIS — M62838 Other muscle spasm: Secondary | ICD-10-CM | POA: Diagnosis not present

## 2022-01-07 DIAGNOSIS — M6281 Muscle weakness (generalized): Secondary | ICD-10-CM | POA: Diagnosis not present
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
## 2022-01-08 LAB — CYTOLOGY - PAP
Comment: NEGATIVE
Diagnosis: NEGATIVE
High risk HPV: NEGATIVE

## 2022-01-12 ENCOUNTER — Ambulatory Visit: Payer: BC Managed Care – PPO

## 2022-01-12 DIAGNOSIS — M6281 Muscle weakness (generalized): Secondary | ICD-10-CM

## 2022-01-12 DIAGNOSIS — M62838 Other muscle spasm: Secondary | ICD-10-CM

## 2022-01-12 DIAGNOSIS — R279 Unspecified lack of coordination: Secondary | ICD-10-CM | POA: Diagnosis not present
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
## 2022-01-18 ENCOUNTER — Ambulatory Visit: Payer: BC Managed Care – PPO

## 2022-01-18 DIAGNOSIS — R279 Unspecified lack of coordination: Secondary | ICD-10-CM | POA: Diagnosis not present

## 2022-01-18 DIAGNOSIS — M62838 Other muscle spasm: Secondary | ICD-10-CM | POA: Diagnosis not present

## 2022-01-18 DIAGNOSIS — M6281 Muscle weakness (generalized): Secondary | ICD-10-CM | POA: Diagnosis not present
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
## 2022-01-19 ENCOUNTER — Ambulatory Visit (INDEPENDENT_AMBULATORY_CARE_PROVIDER_SITE_OTHER): Payer: BC Managed Care – PPO | Admitting: Vascular Surgery

## 2022-01-19 ENCOUNTER — Ambulatory Visit: Payer: BC Managed Care – PPO

## 2022-01-19 ENCOUNTER — Ambulatory Visit (HOSPITAL_COMMUNITY)
Admission: RE | Admit: 2022-01-19 | Discharge: 2022-01-19 | Disposition: A | Discharge status: Home / Self Care | Payer: BC Managed Care – PPO | Source: Ambulatory Visit | Attending: Vascular Surgery | Admitting: Vascular Surgery

## 2022-01-19 ENCOUNTER — Encounter: Payer: Self-pay | Admitting: Vascular Surgery

## 2022-01-19 VITALS — BP 122/76 | HR 64 | Temp 98.6°F | Resp 20 | Ht 62.5 in | Wt 183.8 lb

## 2022-01-19 DIAGNOSIS — M62838 Other muscle spasm: Secondary | ICD-10-CM

## 2022-01-19 DIAGNOSIS — I872 Venous insufficiency (chronic) (peripheral): Secondary | ICD-10-CM | POA: Diagnosis not present

## 2022-01-19 DIAGNOSIS — M6281 Muscle weakness (generalized): Secondary | ICD-10-CM | POA: Diagnosis not present

## 2022-01-19 DIAGNOSIS — R279 Unspecified lack of coordination: Secondary | ICD-10-CM
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
## 2022-01-25 ENCOUNTER — Ambulatory Visit: Payer: BC Managed Care – PPO

## 2022-01-25 ENCOUNTER — Other Ambulatory Visit: Payer: Self-pay

## 2022-01-25 ENCOUNTER — Inpatient Hospital Stay: Payer: BC Managed Care – PPO | Attending: Nurse Practitioner

## 2022-01-25 ENCOUNTER — Encounter: Payer: Self-pay | Admitting: Hematology

## 2022-01-25 DIAGNOSIS — Z79899 Other long term (current) drug therapy: Secondary | ICD-10-CM | POA: Insufficient documentation

## 2022-01-25 DIAGNOSIS — M62838 Other muscle spasm: Secondary | ICD-10-CM

## 2022-01-25 DIAGNOSIS — D500 Iron deficiency anemia secondary to blood loss (chronic): Secondary | ICD-10-CM | POA: Diagnosis not present

## 2022-01-25 DIAGNOSIS — N920 Excessive and frequent menstruation with regular cycle: Secondary | ICD-10-CM | POA: Insufficient documentation

## 2022-01-25 DIAGNOSIS — R279 Unspecified lack of coordination: Secondary | ICD-10-CM | POA: Diagnosis not present

## 2022-01-25 DIAGNOSIS — M6281 Muscle weakness (generalized): Secondary | ICD-10-CM | POA: Diagnosis not present

## 2022-01-25 DIAGNOSIS — Z9049 Acquired absence of other specified parts of digestive tract: Secondary | ICD-10-CM | POA: Insufficient documentation

## 2022-01-25 LAB — CBC WITH DIFFERENTIAL/PLATELET
Abs Immature Granulocytes: 0 10*3/uL (ref 0.00–0.07)
Basophils Absolute: 0.1 10*3/uL (ref 0.0–0.1)
Basophils Relative: 2 %
Eosinophils Absolute: 0.1 10*3/uL (ref 0.0–0.5)
Eosinophils Relative: 3 %
HCT: 38.2 % (ref 36.0–46.0)
Hemoglobin: 12.6 g/dL (ref 12.0–15.0)
Immature Granulocytes: 0 %
Lymphocytes Relative: 55 %
Lymphs Abs: 1.9 10*3/uL (ref 0.7–4.0)
MCH: 30.7 pg (ref 26.0–34.0)
MCHC: 33 g/dL (ref 30.0–36.0)
MCV: 92.9 fL (ref 80.0–100.0)
Monocytes Absolute: 0.3 10*3/uL (ref 0.1–1.0)
Monocytes Relative: 9 %
Neutro Abs: 1 10*3/uL — ABNORMAL LOW (ref 1.7–7.7)
Neutrophils Relative %: 31 %
Platelets: 214 10*3/uL (ref 150–400)
RBC: 4.11 MIL/uL (ref 3.87–5.11)
RDW: 12.7 % (ref 11.5–15.5)
WBC: 3.3 10*3/uL — ABNORMAL LOW (ref 4.0–10.5)
nRBC: 0 % (ref 0.0–0.2)

## 2022-01-25 LAB — IRON AND IRON BINDING CAPACITY (CC-WL,HP ONLY)
Iron: 132 ug/dL (ref 28–170)
Saturation Ratios: 45 % — ABNORMAL HIGH (ref 10.4–31.8)
TIBC: 297 ug/dL (ref 250–450)
UIBC: 165 ug/dL (ref 148–442)

## 2022-01-25 LAB — RETIC PANEL
Immature Retic Fract: 3.6 % (ref 2.3–15.9)
RBC.: 4 MIL/uL (ref 3.87–5.11)
Retic Count, Absolute: 35.2 10*3/uL (ref 19.0–186.0)
Retic Ct Pct: 0.9 % (ref 0.4–3.1)
Reticulocyte Hemoglobin: 33.3 pg (ref >27.9)

## 2022-01-25 LAB — FERRITIN: Ferritin: 120 ng/mL (ref 11–307)
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
## 2022-01-27 ENCOUNTER — Ambulatory Visit: Payer: BC Managed Care – PPO

## 2022-01-27 ENCOUNTER — Encounter: Payer: Self-pay | Admitting: Nurse Practitioner

## 2022-01-27 ENCOUNTER — Inpatient Hospital Stay (HOSPITAL_BASED_OUTPATIENT_CLINIC_OR_DEPARTMENT_OTHER): Payer: BC Managed Care – PPO | Admitting: Nurse Practitioner

## 2022-01-27 DIAGNOSIS — M6281 Muscle weakness (generalized): Secondary | ICD-10-CM

## 2022-01-27 DIAGNOSIS — M62838 Other muscle spasm: Secondary | ICD-10-CM

## 2022-01-27 DIAGNOSIS — D500 Iron deficiency anemia secondary to blood loss (chronic): Secondary | ICD-10-CM

## 2022-01-27 DIAGNOSIS — R279 Unspecified lack of coordination: Secondary | ICD-10-CM
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
## 2022-02-02 ENCOUNTER — Telehealth (HOSPITAL_COMMUNITY): Payer: Self-pay | Admitting: Pharmacy Technician

## 2022-02-02 ENCOUNTER — Ambulatory Visit (INDEPENDENT_AMBULATORY_CARE_PROVIDER_SITE_OTHER): Payer: BC Managed Care – PPO | Admitting: Neurology

## 2022-02-02 ENCOUNTER — Encounter: Payer: Self-pay | Admitting: Neurology

## 2022-02-02 VITALS — BP 130/87 | HR 70 | Ht 62.0 in | Wt 181.8 lb

## 2022-02-02 DIAGNOSIS — G43409 Hemiplegic migraine, not intractable, without status migrainosus: Secondary | ICD-10-CM | POA: Diagnosis not present

## 2022-02-02 DIAGNOSIS — G43009 Migraine without aura, not intractable, without status migrainosus: Secondary | ICD-10-CM | POA: Diagnosis not present

## 2022-02-02 DIAGNOSIS — F419 Anxiety disorder, unspecified: Secondary | ICD-10-CM | POA: Diagnosis not present

## 2022-02-02 MED ORDER — UBRELVY 100 MG PO TABS: 1.0000 | ORAL_TABLET | ORAL | 5 refills | Status: DC | PRN

## 2022-02-02 NOTE — Telephone Encounter (Signed)
Patient Advocate Encounter  Prior Authorization for Roselyn Meier 100MG  tablets has been approved.    PA# [id number] Effective dates: 02/02/2022 through 05/05/2022      Lyndel Safe, [facility] Patient Advocate Specialist [facility] Patient Advocate Team Direct Number: [phone]  Fax: [phone]

## 2022-02-02 NOTE — Telephone Encounter (Signed)
Patient Advocate Encounter   Received notification that prior authorization for Ubrelvy 100MG  tablets is required.   PA submitted on 02/02/2022 Key [id number] Status is pending       02/04/2022, CPhT Pharmacy Patient Advocate Specialist [facility] Patient Advocate Team Direct Number: [phone]  Fax: [phone]

## 2022-02-02 NOTE — Patient Instructions (Signed)
  Start magnesium citrate 400mg  daily, riboflavin 400mg  daily and coenzyme Q-10 100mg  three times daily Take Ubrelvy 100mg  at earliest onset of headache.  May repeat dose once in 2 hours if needed.  Maximum 2 tablets in 24 hours.  Let me know if effective or not.   Limit use of pain relievers to no more than 2 days out of the week.  These medications include acetaminophen, NSAIDs (ibuprofen/Advil/Motrin, naproxen/Aleve, triptans (Imitrex/sumatriptan), Excedrin, and narcotics.  This will help reduce risk of rebound headaches. Be aware of common food triggers:  - Caffeine:  coffee, black tea, cola, Mt. Dew  - Chocolate  - Dairy:  aged cheeses (brie, blue, cheddar, gouda, [place], provolone, Cameron, Swiss, etc), chocolate milk, buttermilk, sour cream, limit eggs and yogurt  - Nuts, peanut butter  - Alcohol  - Cereals/grains:  FRESH breads (fresh bagels, sourdough, doughnuts), yeast productions  - Processed/canned/aged/cured meats (pre-packaged deli meats, hotdogs)  - MSG/glutamate:  soy sauce, flavor enhancer, pickled/preserved/marinated foods  - Sweeteners:  aspartame (Equal, Nutrasweet).  Sugar and Splenda are okay  - Vegetables:  legumes (lima beans, lentils, snow peas, fava beans, pinto peans, peas, garbanzo beans), sauerkraut, onions, olives, pickles  - Fruit:  avocados, bananas, citrus fruit (orange, lemon, grapefruit), mango  - Other:  Frozen meals, macaroni and cheese Routine exercise Stay adequately hydrated (aim for 64 oz water daily) Keep headache diary Maintain proper stress management Maintain proper sleep hygiene Do not skip meals.

## 2022-02-04 ENCOUNTER — Ambulatory Visit: Payer: BC Managed Care – PPO | Attending: Nurse Practitioner

## 2022-02-04 DIAGNOSIS — M6281 Muscle weakness (generalized): Secondary | ICD-10-CM | POA: Diagnosis not present

## 2022-02-04 DIAGNOSIS — R279 Unspecified lack of coordination: Secondary | ICD-10-CM | POA: Diagnosis not present

## 2022-02-04 DIAGNOSIS — M62838 Other muscle spasm: Secondary | ICD-10-CM | POA: Diagnosis not present
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
## 2022-02-09 ENCOUNTER — Ambulatory Visit: Payer: BC Managed Care – PPO

## 2022-02-09 ENCOUNTER — Encounter: Payer: Self-pay | Admitting: Neurology

## 2022-02-09 DIAGNOSIS — M62838 Other muscle spasm: Secondary | ICD-10-CM

## 2022-02-09 DIAGNOSIS — M6281 Muscle weakness (generalized): Secondary | ICD-10-CM | POA: Diagnosis not present

## 2022-02-09 DIAGNOSIS — R279 Unspecified lack of coordination: Secondary | ICD-10-CM
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
## 2022-02-11 ENCOUNTER — Ambulatory Visit: Payer: BC Managed Care – PPO

## 2022-02-11 DIAGNOSIS — M62838 Other muscle spasm: Secondary | ICD-10-CM

## 2022-02-11 DIAGNOSIS — R279 Unspecified lack of coordination: Secondary | ICD-10-CM | POA: Diagnosis not present

## 2022-02-11 DIAGNOSIS — Z0279 Encounter for issue of other medical certificate: Secondary | ICD-10-CM

## 2022-02-11 DIAGNOSIS — M6281 Muscle weakness (generalized): Secondary | ICD-10-CM | POA: Diagnosis not present
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
## 2022-02-12 NOTE — Progress Notes (Signed)
ADA form filled out and waiting at the front desk.

## 2022-02-15 ENCOUNTER — Ambulatory Visit: Payer: BC Managed Care – PPO

## 2022-02-15 DIAGNOSIS — R279 Unspecified lack of coordination: Secondary | ICD-10-CM

## 2022-02-15 DIAGNOSIS — M6281 Muscle weakness (generalized): Secondary | ICD-10-CM

## 2022-02-15 DIAGNOSIS — M62838 Other muscle spasm: Secondary | ICD-10-CM
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
## 2022-02-17 ENCOUNTER — Ambulatory Visit: Payer: BC Managed Care – PPO

## 2022-02-17 DIAGNOSIS — M62838 Other muscle spasm: Secondary | ICD-10-CM

## 2022-02-17 DIAGNOSIS — M6281 Muscle weakness (generalized): Secondary | ICD-10-CM | POA: Diagnosis not present

## 2022-02-17 DIAGNOSIS — R279 Unspecified lack of coordination: Secondary | ICD-10-CM
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
## 2022-02-22 ENCOUNTER — Ambulatory Visit: Payer: BC Managed Care – PPO

## 2022-02-22 DIAGNOSIS — R279 Unspecified lack of coordination: Secondary | ICD-10-CM

## 2022-02-22 DIAGNOSIS — M62838 Other muscle spasm: Secondary | ICD-10-CM | POA: Diagnosis not present

## 2022-02-22 DIAGNOSIS — M6281 Muscle weakness (generalized): Secondary | ICD-10-CM
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
## 2022-02-25 ENCOUNTER — Ambulatory Visit: Payer: BC Managed Care – PPO

## 2022-02-25 DIAGNOSIS — M62838 Other muscle spasm: Secondary | ICD-10-CM

## 2022-02-25 DIAGNOSIS — M6281 Muscle weakness (generalized): Secondary | ICD-10-CM

## 2022-02-25 DIAGNOSIS — R279 Unspecified lack of coordination: Secondary | ICD-10-CM | POA: Diagnosis not present
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
## 2022-03-02 ENCOUNTER — Ambulatory Visit: Payer: BC Managed Care – PPO

## 2022-03-02 DIAGNOSIS — M6281 Muscle weakness (generalized): Secondary | ICD-10-CM

## 2022-03-02 DIAGNOSIS — M62838 Other muscle spasm: Secondary | ICD-10-CM | POA: Diagnosis not present

## 2022-03-02 DIAGNOSIS — R279 Unspecified lack of coordination: Secondary | ICD-10-CM

## 2022-03-03 ENCOUNTER — Ambulatory Visit: Payer: BC Managed Care – PPO

## 2022-03-03 DIAGNOSIS — M6281 Muscle weakness (generalized): Secondary | ICD-10-CM

## 2022-03-03 DIAGNOSIS — M62838 Other muscle spasm: Secondary | ICD-10-CM

## 2022-03-03 DIAGNOSIS — R279 Unspecified lack of coordination: Secondary | ICD-10-CM
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
## 2022-03-10 ENCOUNTER — Ambulatory Visit: Payer: BC Managed Care – PPO | Attending: Nurse Practitioner

## 2022-03-10 DIAGNOSIS — M62838 Other muscle spasm: Secondary | ICD-10-CM | POA: Diagnosis not present

## 2022-03-10 DIAGNOSIS — R279 Unspecified lack of coordination: Secondary | ICD-10-CM | POA: Insufficient documentation

## 2022-03-10 DIAGNOSIS — M6281 Muscle weakness (generalized): Secondary | ICD-10-CM | POA: Insufficient documentation
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
## 2022-03-11 DIAGNOSIS — K648 Other hemorrhoids: Secondary | ICD-10-CM | POA: Diagnosis not present

## 2022-03-11 DIAGNOSIS — K6389 Other specified diseases of intestine: Secondary | ICD-10-CM | POA: Diagnosis not present

## 2022-03-11 DIAGNOSIS — Z1211 Encounter for screening for malignant neoplasm of colon: Secondary | ICD-10-CM | POA: Diagnosis not present

## 2022-03-15 ENCOUNTER — Ambulatory Visit: Payer: BC Managed Care – PPO

## 2022-03-15 DIAGNOSIS — R279 Unspecified lack of coordination: Secondary | ICD-10-CM | POA: Diagnosis not present

## 2022-03-15 DIAGNOSIS — M6281 Muscle weakness (generalized): Secondary | ICD-10-CM

## 2022-03-15 DIAGNOSIS — M62838 Other muscle spasm: Secondary | ICD-10-CM
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
## 2022-03-17 ENCOUNTER — Ambulatory Visit: Payer: BC Managed Care – PPO

## 2022-03-17 DIAGNOSIS — R279 Unspecified lack of coordination: Secondary | ICD-10-CM | POA: Diagnosis not present

## 2022-03-17 DIAGNOSIS — M62838 Other muscle spasm: Secondary | ICD-10-CM | POA: Diagnosis not present

## 2022-03-17 DIAGNOSIS — M6281 Muscle weakness (generalized): Secondary | ICD-10-CM
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
## 2022-03-22 ENCOUNTER — Ambulatory Visit: Payer: BC Managed Care – PPO

## 2022-03-22 DIAGNOSIS — R279 Unspecified lack of coordination: Secondary | ICD-10-CM | POA: Diagnosis not present

## 2022-03-22 DIAGNOSIS — M6281 Muscle weakness (generalized): Secondary | ICD-10-CM

## 2022-03-22 DIAGNOSIS — M62838 Other muscle spasm: Secondary | ICD-10-CM | POA: Diagnosis not present
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
## 2022-03-24 ENCOUNTER — Ambulatory Visit: Payer: BC Managed Care – PPO

## 2022-03-24 DIAGNOSIS — R279 Unspecified lack of coordination: Secondary | ICD-10-CM

## 2022-03-24 DIAGNOSIS — M6281 Muscle weakness (generalized): Secondary | ICD-10-CM | POA: Diagnosis not present

## 2022-03-24 DIAGNOSIS — M62838 Other muscle spasm: Secondary | ICD-10-CM
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
## 2022-03-29 ENCOUNTER — Ambulatory Visit: Payer: BC Managed Care – PPO

## 2022-03-29 DIAGNOSIS — R279 Unspecified lack of coordination: Secondary | ICD-10-CM | POA: Diagnosis not present

## 2022-03-29 DIAGNOSIS — M62838 Other muscle spasm: Secondary | ICD-10-CM

## 2022-03-29 DIAGNOSIS — M6281 Muscle weakness (generalized): Secondary | ICD-10-CM
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
## 2022-03-31 ENCOUNTER — Ambulatory Visit: Payer: BC Managed Care – PPO

## 2022-03-31 DIAGNOSIS — M62838 Other muscle spasm: Secondary | ICD-10-CM

## 2022-03-31 DIAGNOSIS — R279 Unspecified lack of coordination: Secondary | ICD-10-CM | POA: Diagnosis not present

## 2022-03-31 DIAGNOSIS — M6281 Muscle weakness (generalized): Secondary | ICD-10-CM | POA: Diagnosis not present
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
## 2022-04-05 ENCOUNTER — Ambulatory Visit: Payer: BC Managed Care – PPO

## 2022-04-05 DIAGNOSIS — M6281 Muscle weakness (generalized): Secondary | ICD-10-CM | POA: Diagnosis not present

## 2022-04-05 DIAGNOSIS — R279 Unspecified lack of coordination: Secondary | ICD-10-CM | POA: Diagnosis not present

## 2022-04-05 DIAGNOSIS — M62838 Other muscle spasm: Secondary | ICD-10-CM
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
## 2022-04-07 ENCOUNTER — Ambulatory Visit: Payer: BC Managed Care – PPO

## 2022-04-07 ENCOUNTER — Telehealth (HOSPITAL_COMMUNITY): Payer: Self-pay | Admitting: Pharmacy Technician

## 2022-04-07 NOTE — Telephone Encounter (Signed)
Patient Advocate Encounter   Received notification that prior authorization for Ubrelvy 100MG  tablets is required.   PA submitted on 04/07/2022 Key [id number] Status is pending       06/07/2022, CPhT Pharmacy Patient Advocate Specialist [facility] Patient Advocate Team Direct Number: [phone]  Fax: [phone]

## 2022-04-07 NOTE — Telephone Encounter (Signed)
Patient Advocate Encounter  Prior Authorization for Bernita Raisin 100MG  tablets has been approved.    PA# Effective dates: 04/07/2022 through 04/08/2023      06/08/2023, CPhT Pharmacy Patient Advocate Specialist [facility] Patient Advocate Team Direct Number: [phone]  Fax: [phone]

## 2022-04-22 DIAGNOSIS — Z1231 Encounter for screening mammogram for malignant neoplasm of breast: Secondary | ICD-10-CM | POA: Diagnosis not present

## 2022-05-06 ENCOUNTER — Encounter (HOSPITAL_BASED_OUTPATIENT_CLINIC_OR_DEPARTMENT_OTHER): Payer: Self-pay | Admitting: *Deleted

## 2022-05-28 ENCOUNTER — Other Ambulatory Visit: Payer: Self-pay

## 2022-05-28 ENCOUNTER — Inpatient Hospital Stay: Payer: BC Managed Care – PPO | Attending: Hematology

## 2022-05-28 DIAGNOSIS — D500 Iron deficiency anemia secondary to blood loss (chronic): Secondary | ICD-10-CM | POA: Diagnosis not present

## 2022-05-28 DIAGNOSIS — N920 Excessive and frequent menstruation with regular cycle: Secondary | ICD-10-CM | POA: Insufficient documentation

## 2022-05-28 LAB — CBC WITH DIFFERENTIAL/PLATELET
Abs Immature Granulocytes: 0.01 10*3/uL (ref 0.00–0.07)
Basophils Absolute: 0.1 10*3/uL (ref 0.0–0.1)
Basophils Relative: 1 %
Eosinophils Absolute: 0.1 10*3/uL (ref 0.0–0.5)
Eosinophils Relative: 2 %
HCT: 38.6 % (ref 36.0–46.0)
Hemoglobin: 12.9 g/dL (ref 12.0–15.0)
Immature Granulocytes: 0 %
Lymphocytes Relative: 33 %
Lymphs Abs: 2.2 10*3/uL (ref 0.7–4.0)
MCH: 30.9 pg (ref 26.0–34.0)
MCHC: 33.4 g/dL (ref 30.0–36.0)
MCV: 92.3 fL (ref 80.0–100.0)
Monocytes Absolute: 0.5 10*3/uL (ref 0.1–1.0)
Monocytes Relative: 7 %
Neutro Abs: 3.8 10*3/uL (ref 1.7–7.7)
Neutrophils Relative %: 57 %
Platelets: 255 10*3/uL (ref 150–400)
RBC: 4.18 MIL/uL (ref 3.87–5.11)
RDW: 12.5 % (ref 11.5–15.5)
WBC: 6.7 10*3/uL (ref 4.0–10.5)
nRBC: 0 % (ref 0.0–0.2)

## 2022-05-28 LAB — IRON AND IRON BINDING CAPACITY (CC-WL,HP ONLY)
Iron: 33 ug/dL (ref 28–170)
Saturation Ratios: 11 % (ref 10.4–31.8)
TIBC: 312 ug/dL (ref 250–450)
UIBC: 279 ug/dL (ref 148–442)

## 2022-05-28 LAB — RETIC PANEL
Immature Retic Fract: 4 % (ref 2.3–15.9)
RBC.: 4.21 MIL/uL (ref 3.87–5.11)
Retic Count, Absolute: 52.2 10*3/uL (ref 19.0–186.0)
Retic Ct Pct: 1.2 % (ref 0.4–3.1)
Reticulocyte Hemoglobin: 33.1 pg (ref >27.9)

## 2022-05-28 LAB — CMP (CANCER CENTER ONLY)
ALT: 11 U/L (ref 0–44)
AST: 17 U/L (ref 15–41)
Albumin: 4 g/dL (ref 3.5–5.0)
Alkaline Phosphatase: 46 U/L (ref 38–126)
Anion gap: 3 — ABNORMAL LOW (ref 5–15)
BUN: 8 mg/dL (ref 6–20)
CO2: 32 mmol/L (ref 22–32)
Calcium: 9.1 mg/dL (ref 8.9–10.3)
Chloride: 105 mmol/L (ref 98–111)
Creatinine: 0.9 mg/dL (ref 0.44–1.00)
GFR, Estimated: >60 mL/min (ref >60)
Glucose, Bld: 117 mg/dL — ABNORMAL HIGH (ref 70–99)
Potassium: 3.5 mmol/L (ref 3.5–5.1)
Sodium: 140 mmol/L (ref 135–145)
Total Bilirubin: 0.4 mg/dL (ref 0.3–1.2)
Total Protein: 6.5 g/dL (ref 6.5–8.1)

## 2022-05-28 LAB — FERRITIN: Ferritin: 165 ng/mL (ref 11–307)

## 2022-05-31 ENCOUNTER — Inpatient Hospital Stay: Payer: BC Managed Care – PPO

## 2022-06-02 ENCOUNTER — Telehealth: Payer: Commercial Managed Care - PPO | Admitting: Nurse Practitioner
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
## 2022-06-29 ENCOUNTER — Ambulatory Visit (INDEPENDENT_AMBULATORY_CARE_PROVIDER_SITE_OTHER): Payer: BC Managed Care – PPO | Admitting: Neurology

## 2022-06-29 ENCOUNTER — Encounter: Payer: Self-pay | Admitting: Neurology

## 2022-06-29 VITALS — BP 137/85 | HR 70 | Ht 62.0 in | Wt 167.0 lb

## 2022-06-29 DIAGNOSIS — G43009 Migraine without aura, not intractable, without status migrainosus: Secondary | ICD-10-CM

## 2022-06-29 DIAGNOSIS — G43409 Hemiplegic migraine, not intractable, without status migrainosus: Secondary | ICD-10-CM | POA: Diagnosis not present

## 2022-06-29 MED ORDER — NORTRIPTYLINE HCL 10 MG PO CAPS: 10.0000 mg | ORAL_CAPSULE | Freq: Every day | ORAL | 5 refills | Status: DC

## 2022-06-29 MED ORDER — NURTEC 75 MG PO TBDP: ORAL_TABLET | ORAL | 0 refills | Status: DC

## 2022-06-29 NOTE — Patient Instructions (Addendum)
Start nortriptyline 10mg  at bedtime.  If no improvement in 4 weeks, contact me and we can increase dose When you get a migraine, try Nurtec (1 in 24 hours).  Otherwise, retry Roselyn Meier (can repeat once after 2 hours) Limit use of pain relievers to no more than 2 days out of week to prevent risk of rebound or medication-overuse headache. Keep headache diary Magnesium citrate 400mg  daily, CoQ10 100mg  three times daily Follow up 4-5 months.

## 2022-06-30 ENCOUNTER — Encounter (HOSPITAL_BASED_OUTPATIENT_CLINIC_OR_DEPARTMENT_OTHER): Payer: Self-pay | Admitting: Obstetrics & Gynecology

## 2022-07-21 ENCOUNTER — Other Ambulatory Visit: Payer: Self-pay | Admitting: Neurology

## 2022-10-08 DIAGNOSIS — R5383 Other fatigue: Secondary | ICD-10-CM | POA: Diagnosis not present

## 2022-10-08 DIAGNOSIS — R509 Fever, unspecified: Secondary | ICD-10-CM | POA: Diagnosis not present

## 2022-10-08 DIAGNOSIS — U071 COVID-19: Secondary | ICD-10-CM | POA: Diagnosis not present

## 2022-10-08 DIAGNOSIS — R52 Pain, unspecified: Secondary | ICD-10-CM | POA: Diagnosis not present

## 2022-10-21 ENCOUNTER — Other Ambulatory Visit: Payer: Self-pay | Admitting: Neurology

## 2022-10-25 DIAGNOSIS — Z0000 Encounter for general adult medical examination without abnormal findings: Secondary | ICD-10-CM | POA: Diagnosis not present

## 2022-10-25 DIAGNOSIS — E538 Deficiency of other specified B group vitamins: Secondary | ICD-10-CM | POA: Diagnosis not present

## 2022-10-25 DIAGNOSIS — D508 Other iron deficiency anemias: Secondary | ICD-10-CM | POA: Diagnosis not present

## 2022-10-25 DIAGNOSIS — R030 Elevated blood-pressure reading, without diagnosis of hypertension: Secondary | ICD-10-CM | POA: Diagnosis not present

## 2022-10-25 DIAGNOSIS — E559 Vitamin D deficiency, unspecified: Secondary | ICD-10-CM | POA: Diagnosis not present

## 2022-10-25 DIAGNOSIS — E782 Mixed hyperlipidemia: Secondary | ICD-10-CM | POA: Diagnosis not present
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
## 2022-11-03 ENCOUNTER — Ambulatory Visit (INDEPENDENT_AMBULATORY_CARE_PROVIDER_SITE_OTHER): Payer: BC Managed Care – PPO | Admitting: Neurology

## 2022-11-03 ENCOUNTER — Encounter: Payer: Self-pay | Admitting: Neurology

## 2022-11-03 VITALS — BP 140/72 | HR 68 | Ht 62.0 in | Wt 180.7 lb

## 2022-11-03 DIAGNOSIS — G43409 Hemiplegic migraine, not intractable, without status migrainosus: Secondary | ICD-10-CM | POA: Diagnosis not present

## 2022-11-03 DIAGNOSIS — H5462 Unqualified visual loss, left eye, normal vision right eye: Secondary | ICD-10-CM | POA: Diagnosis not present

## 2022-11-03 DIAGNOSIS — G43109 Migraine with aura, not intractable, without status migrainosus: Secondary | ICD-10-CM | POA: Diagnosis not present

## 2022-11-03 MED ORDER — AJOVY 225 MG/1.5ML ~~LOC~~ SOAJ: 225.0000 mg | SUBCUTANEOUS | 11 refills | Status: DC

## 2022-11-03 MED ORDER — NURTEC 75 MG PO TBDP: 75.0000 mg | ORAL_TABLET | Freq: Every day | ORAL | 11 refills | Status: DC | PRN

## 2022-11-03 NOTE — Patient Instructions (Addendum)
Start Ajovy every 28 days.  Continue nortriptyline for now Take Nurtec one daily as needed for acute migraine attack Check MRI of brain without contrast and carotid ultrasound Follow up 4 to 5 months.

## 2022-11-04 ENCOUNTER — Other Ambulatory Visit: Payer: Self-pay

## 2022-11-04 DIAGNOSIS — H5462 Unqualified visual loss, left eye, normal vision right eye: Secondary | ICD-10-CM

## 2022-11-05 ENCOUNTER — Other Ambulatory Visit: Payer: Self-pay | Admitting: Neurology

## 2022-11-14 ENCOUNTER — Ambulatory Visit
Admission: RE | Admit: 2022-11-14 | Discharge: 2022-11-14 | Disposition: A | Discharge status: Home / Self Care | Payer: BC Managed Care – PPO | Source: Ambulatory Visit | Attending: Neurology | Admitting: Neurology

## 2022-11-14 DIAGNOSIS — G43109 Migraine with aura, not intractable, without status migrainosus: Secondary | ICD-10-CM

## 2022-11-14 DIAGNOSIS — G43409 Hemiplegic migraine, not intractable, without status migrainosus: Secondary | ICD-10-CM

## 2022-11-14 DIAGNOSIS — R519 Headache, unspecified: Secondary | ICD-10-CM | POA: Diagnosis not present

## 2022-11-15 ENCOUNTER — Ambulatory Visit
Admission: RE | Admit: 2022-11-15 | Discharge: 2022-11-15 | Disposition: A | Discharge status: Home / Self Care | Payer: BC Managed Care – PPO | Source: Ambulatory Visit | Attending: Neurology | Admitting: Neurology

## 2022-11-15 DIAGNOSIS — H5462 Unqualified visual loss, left eye, normal vision right eye: Secondary | ICD-10-CM

## 2022-11-15 DIAGNOSIS — H547 Unspecified visual loss: Secondary | ICD-10-CM | POA: Diagnosis not present

## 2022-11-15 DIAGNOSIS — G43909 Migraine, unspecified, not intractable, without status migrainosus: Secondary | ICD-10-CM | POA: Diagnosis not present

## 2022-11-15 NOTE — Progress Notes (Signed)
LMOVM for patient please give the office a call back.[name]

## 2022-11-16 ENCOUNTER — Other Ambulatory Visit (HOSPITAL_COMMUNITY): Payer: Self-pay

## 2022-11-16 ENCOUNTER — Encounter: Payer: Self-pay | Admitting: Hematology and Oncology

## 2022-11-17 ENCOUNTER — Encounter: Payer: Self-pay | Admitting: Hematology and Oncology

## 2022-11-17 ENCOUNTER — Other Ambulatory Visit (HOSPITAL_COMMUNITY): Payer: Self-pay

## 2022-11-23 DIAGNOSIS — M25562 Pain in left knee: Secondary | ICD-10-CM | POA: Diagnosis not present

## 2022-11-23 DIAGNOSIS — M170 Bilateral primary osteoarthritis of knee: Secondary | ICD-10-CM | POA: Diagnosis not present

## 2022-11-23 DIAGNOSIS — M25561 Pain in right knee: Secondary | ICD-10-CM | POA: Diagnosis not present

## 2022-11-24 ENCOUNTER — Telehealth: Payer: Self-pay | Admitting: Pharmacy Technician

## 2022-11-30 ENCOUNTER — Other Ambulatory Visit (HOSPITAL_COMMUNITY): Payer: Self-pay

## 2022-11-30 ENCOUNTER — Encounter: Payer: Self-pay | Admitting: Hematology and Oncology

## 2022-12-01 ENCOUNTER — Other Ambulatory Visit (HOSPITAL_COMMUNITY): Payer: Self-pay

## 2022-12-17 DIAGNOSIS — E538 Deficiency of other specified B group vitamins: Secondary | ICD-10-CM | POA: Diagnosis not present

## 2022-12-17 DIAGNOSIS — R748 Abnormal levels of other serum enzymes: Secondary | ICD-10-CM | POA: Diagnosis not present

## 2022-12-23 ENCOUNTER — Other Ambulatory Visit (HOSPITAL_COMMUNITY): Payer: Self-pay

## 2022-12-23 ENCOUNTER — Telehealth: Payer: Self-pay | Admitting: Pharmacy Technician

## 2022-12-23 NOTE — Telephone Encounter (Signed)
Patient Advocate Encounter   Received notification from [employer] that prior authorization for NURTEC  is required.  PA# PAC [id number]  PA submitted on 4.18.24 VIA PHONE & FAX PHONE [phone] FAX [phone] (sent chart notes) Status is pending

## 2022-12-23 NOTE — Telephone Encounter (Signed)
Patient Advocate Encounter  Received notification from [employer] that prior authorization for AJOVY is required.   PA# PAC [id number] PA submitted on 4.18.24 VIA PHONE & FAX PHONE [phone] FAX [phone] (sent chart notes) Status is pending

## 2022-12-24 NOTE — Telephone Encounter (Signed)
[employer] is needing a call back with information regarding AJOVY PA.

## 2022-12-27 ENCOUNTER — Telehealth: Payer: Self-pay | Admitting: Anesthesiology

## 2022-12-27 NOTE — Telephone Encounter (Signed)
Ajovy PA Denied- Full letter in Media

## 2022-12-27 NOTE — Telephone Encounter (Signed)
Addressed in other encounter 

## 2022-12-27 NOTE — Telephone Encounter (Signed)
I have faxed the more information back to the insurance concerning Ajovy

## 2022-12-27 NOTE — Telephone Encounter (Signed)
Maggie from [facility] PA department left message with AN stating she needs clinical information for Ajovy, Reference # [id number].

## 2022-12-31 ENCOUNTER — Encounter (HOSPITAL_BASED_OUTPATIENT_CLINIC_OR_DEPARTMENT_OTHER): Payer: Self-pay | Admitting: Obstetrics & Gynecology

## 2022-12-31 DIAGNOSIS — Z713 Dietary counseling and surveillance: Secondary | ICD-10-CM

## 2023-01-04 ENCOUNTER — Other Ambulatory Visit (HOSPITAL_BASED_OUTPATIENT_CLINIC_OR_DEPARTMENT_OTHER): Payer: Self-pay | Admitting: Obstetrics & Gynecology

## 2023-01-04 DIAGNOSIS — R634 Abnormal weight loss: Secondary | ICD-10-CM

## 2023-01-15 ENCOUNTER — Encounter: Payer: Self-pay | Admitting: Neurology

## 2023-01-17 ENCOUNTER — Other Ambulatory Visit (HOSPITAL_COMMUNITY): Payer: Self-pay

## 2023-01-17 ENCOUNTER — Other Ambulatory Visit: Payer: Self-pay | Admitting: Neurology

## 2023-01-17 ENCOUNTER — Telehealth: Payer: Self-pay

## 2023-01-17 NOTE — Telephone Encounter (Signed)
PER Dr. Everlena Cooper please restart PA for Ajovy.  Can we add Amitriptyline is the same as Nortriptyline

## 2023-01-17 NOTE — Telephone Encounter (Addendum)
PA needed for Nurtec. 

## 2023-01-19 ENCOUNTER — Other Ambulatory Visit (HOSPITAL_BASED_OUTPATIENT_CLINIC_OR_DEPARTMENT_OTHER): Payer: Self-pay | Admitting: Obstetrics & Gynecology

## 2023-01-19 ENCOUNTER — Encounter (HOSPITAL_BASED_OUTPATIENT_CLINIC_OR_DEPARTMENT_OTHER): Payer: Self-pay | Admitting: Obstetrics & Gynecology

## 2023-01-19 ENCOUNTER — Ambulatory Visit (INDEPENDENT_AMBULATORY_CARE_PROVIDER_SITE_OTHER): Payer: BC Managed Care – PPO | Admitting: Obstetrics & Gynecology

## 2023-01-19 ENCOUNTER — Other Ambulatory Visit (HOSPITAL_COMMUNITY)
Admission: RE | Admit: 2023-01-19 | Discharge: 2023-01-19 | Disposition: A | Discharge status: Home / Self Care | Payer: BC Managed Care – PPO | Source: Ambulatory Visit | Attending: Obstetrics & Gynecology | Admitting: Obstetrics & Gynecology

## 2023-01-19 VITALS — BP 127/85 | HR 59 | Ht 62.5 in | Wt 184.0 lb

## 2023-01-19 DIAGNOSIS — Z113 Encounter for screening for infections with a predominantly sexual mode of transmission: Secondary | ICD-10-CM | POA: Diagnosis not present

## 2023-01-19 DIAGNOSIS — G43709 Chronic migraine without aura, not intractable, without status migrainosus: Secondary | ICD-10-CM

## 2023-01-19 DIAGNOSIS — D500 Iron deficiency anemia secondary to blood loss (chronic): Secondary | ICD-10-CM | POA: Diagnosis not present

## 2023-01-19 DIAGNOSIS — M7989 Other specified soft tissue disorders: Secondary | ICD-10-CM

## 2023-01-19 DIAGNOSIS — Z01419 Encounter for gynecological examination (general) (routine) without abnormal findings: Secondary | ICD-10-CM

## 2023-01-19 DIAGNOSIS — Z1389 Encounter for screening for other disorder: Secondary | ICD-10-CM | POA: Diagnosis not present

## 2023-01-19 DIAGNOSIS — Z8614 Personal history of Methicillin resistant Staphylococcus aureus infection: Secondary | ICD-10-CM

## 2023-01-19 DIAGNOSIS — L732 Hidradenitis suppurativa: Secondary | ICD-10-CM | POA: Diagnosis not present
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
## 2023-01-20 LAB — CERVICOVAGINAL ANCILLARY ONLY
Chlamydia: NEGATIVE
Comment: NEGATIVE
Comment: NEGATIVE
Comment: NORMAL
Neisseria Gonorrhea: NEGATIVE
Trichomonas: NEGATIVE

## 2023-01-20 LAB — HEPATITIS C ANTIBODY: Hep C Virus Ab: NONREACTIVE

## 2023-01-20 LAB — RPR+HBSAG+HIV
HIV Screen 4th Generation wRfx: NONREACTIVE
Hepatitis B Surface Ag: NEGATIVE
RPR Ser Ql: NONREACTIVE

## 2023-01-21 ENCOUNTER — Other Ambulatory Visit: Payer: Self-pay

## 2023-01-21 ENCOUNTER — Other Ambulatory Visit (HOSPITAL_BASED_OUTPATIENT_CLINIC_OR_DEPARTMENT_OTHER): Payer: Self-pay | Admitting: Obstetrics & Gynecology

## 2023-01-21 DIAGNOSIS — Z9889 Other specified postprocedural states: Secondary | ICD-10-CM

## 2023-01-21 DIAGNOSIS — D500 Iron deficiency anemia secondary to blood loss (chronic): Secondary | ICD-10-CM

## 2023-01-21 DIAGNOSIS — R109 Unspecified abdominal pain: Secondary | ICD-10-CM

## 2023-01-24 ENCOUNTER — Telehealth: Payer: Self-pay | Admitting: Pharmacy Technician

## 2023-01-24 ENCOUNTER — Inpatient Hospital Stay: Payer: BC Managed Care – PPO | Attending: Hematology

## 2023-01-24 ENCOUNTER — Other Ambulatory Visit (HOSPITAL_COMMUNITY): Payer: Self-pay

## 2023-01-24 DIAGNOSIS — D500 Iron deficiency anemia secondary to blood loss (chronic): Secondary | ICD-10-CM

## 2023-01-24 DIAGNOSIS — D509 Iron deficiency anemia, unspecified: Secondary | ICD-10-CM | POA: Diagnosis not present

## 2023-01-24 LAB — CMP (CANCER CENTER ONLY)
ALT: 14 U/L (ref 0–44)
AST: 21 U/L (ref 15–41)
Albumin: 4 g/dL (ref 3.5–5.0)
Alkaline Phosphatase: 63 U/L (ref 38–126)
Anion gap: 4 — ABNORMAL LOW (ref 5–15)
BUN: 11 mg/dL (ref 6–20)
CO2: 30 mmol/L (ref 22–32)
Calcium: 8.7 mg/dL — ABNORMAL LOW (ref 8.9–10.3)
Chloride: 108 mmol/L (ref 98–111)
Creatinine: 0.9 mg/dL (ref 0.44–1.00)
GFR, Estimated: >60 mL/min (ref >60)
Glucose, Bld: 78 mg/dL (ref 70–99)
Potassium: 3.6 mmol/L (ref 3.5–5.1)
Sodium: 142 mmol/L (ref 135–145)
Total Bilirubin: 0.3 mg/dL (ref 0.3–1.2)
Total Protein: 6.5 g/dL (ref 6.5–8.1)

## 2023-01-24 LAB — CBC WITH DIFFERENTIAL (CANCER CENTER ONLY)
Abs Immature Granulocytes: 0.01 10*3/uL (ref 0.00–0.07)
Basophils Absolute: 0 10*3/uL (ref 0.0–0.1)
Basophils Relative: 1 %
Eosinophils Absolute: 0.1 10*3/uL (ref 0.0–0.5)
Eosinophils Relative: 2 %
HCT: 37.4 % (ref 36.0–46.0)
Hemoglobin: 12.5 g/dL (ref 12.0–15.0)
Immature Granulocytes: 0 %
Lymphocytes Relative: 42 %
Lymphs Abs: 1.5 10*3/uL (ref 0.7–4.0)
MCH: 30.6 pg (ref 26.0–34.0)
MCHC: 33.4 g/dL (ref 30.0–36.0)
MCV: 91.4 fL (ref 80.0–100.0)
Monocytes Absolute: 0.3 10*3/uL (ref 0.1–1.0)
Monocytes Relative: 9 %
Neutro Abs: 1.7 10*3/uL (ref 1.7–7.7)
Neutrophils Relative %: 46 %
Platelet Count: 226 10*3/uL (ref 150–400)
RBC: 4.09 MIL/uL (ref 3.87–5.11)
RDW: 12.4 % (ref 11.5–15.5)
WBC Count: 3.7 10*3/uL — ABNORMAL LOW (ref 4.0–10.5)
nRBC: 0 % (ref 0.0–0.2)

## 2023-01-24 LAB — RETIC PANEL
Immature Retic Fract: 6 % (ref 2.3–15.9)
RBC.: 4.06 MIL/uL (ref 3.87–5.11)
Retic Count, Absolute: 50.8 10*3/uL (ref 19.0–186.0)
Retic Ct Pct: 1.3 % (ref 0.4–3.1)
Reticulocyte Hemoglobin: 34.2 pg (ref >27.9)

## 2023-01-24 LAB — IRON AND IRON BINDING CAPACITY (CC-WL,HP ONLY)
Iron: 55 ug/dL (ref 28–170)
Saturation Ratios: 17 % (ref 10.4–31.8)
TIBC: 326 ug/dL (ref 250–450)
UIBC: 271 ug/dL (ref 148–442)

## 2023-01-24 LAB — FERRITIN: Ferritin: 101 ng/mL (ref 11–307)

## 2023-01-24 NOTE — Telephone Encounter (Signed)
PA has been approved. Please see other encounter

## 2023-01-24 NOTE — Telephone Encounter (Signed)
Patient Advocate Encounter  Prior Authorization for AJOVY 225MG  has been approved with [employer].    PA# [id number] Effective dates: 5.20.24 through 11.20.24  Per [employer] test claim, copay for 28 days supply is $24.98 w/[employer].

## 2023-01-25 NOTE — Telephone Encounter (Signed)
LMOVm for patient, ajovy approved.
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
## 2023-01-27 ENCOUNTER — Encounter: Payer: Self-pay | Admitting: Nurse Practitioner

## 2023-01-27 ENCOUNTER — Inpatient Hospital Stay (HOSPITAL_BASED_OUTPATIENT_CLINIC_OR_DEPARTMENT_OTHER): Payer: BC Managed Care – PPO | Admitting: Nurse Practitioner

## 2023-01-27 DIAGNOSIS — D500 Iron deficiency anemia secondary to blood loss (chronic): Secondary | ICD-10-CM | POA: Diagnosis not present

## 2023-02-02 ENCOUNTER — Other Ambulatory Visit (HOSPITAL_COMMUNITY): Payer: Self-pay

## 2023-02-03 ENCOUNTER — Other Ambulatory Visit (HOSPITAL_COMMUNITY): Payer: Self-pay

## 2023-02-03 ENCOUNTER — Telehealth: Payer: Self-pay

## 2023-02-03 ENCOUNTER — Telehealth: Payer: Self-pay | Admitting: Pharmacy Technician

## 2023-02-03 NOTE — Telephone Encounter (Signed)
Per Patient PA still pending for Nurtec. Please advise.

## 2023-02-03 NOTE — Telephone Encounter (Signed)
Patient Advocate Encounter  Prior Authorization for NURTEC 75MG  has been approved with [employer].    PA# [id number] Effective dates: 5.30.24 through 5.30.25  Per [facility] test claim, copay for #16 tablets/30 days supply is $0

## 2023-02-03 NOTE — Telephone Encounter (Signed)
Patient Advocate Encounter  Received notification from [employer] that prior authorization for NURTEC 75MG  is required.   PA submitted on 5.30.24 VIA PHONE PHONE: [phone] PA# [id number] Status is pending

## 2023-02-03 NOTE — Telephone Encounter (Signed)
PA has been submitted EXPEDITED, and telephone encounter has been created.  

## 2023-02-04 ENCOUNTER — Other Ambulatory Visit: Payer: Self-pay | Admitting: Neurology

## 2023-02-04 MED ORDER — NURTEC 75 MG PO TBDP: 75.0000 mg | ORAL_TABLET | Freq: Every day | ORAL | 11 refills | Status: DC | PRN

## 2023-02-04 NOTE — Telephone Encounter (Signed)
Patient advised.

## 2023-02-22 DIAGNOSIS — E668 Other obesity: Secondary | ICD-10-CM | POA: Diagnosis not present

## 2023-02-22 DIAGNOSIS — E782 Mixed hyperlipidemia: Secondary | ICD-10-CM | POA: Diagnosis not present

## 2023-02-22 DIAGNOSIS — Z6833 Body mass index (BMI) 33.0-33.9, adult: Secondary | ICD-10-CM | POA: Diagnosis not present

## 2023-02-27 ENCOUNTER — Encounter (HOSPITAL_BASED_OUTPATIENT_CLINIC_OR_DEPARTMENT_OTHER): Payer: Self-pay | Admitting: Obstetrics & Gynecology

## 2023-02-28 DIAGNOSIS — U071 COVID-19: Secondary | ICD-10-CM | POA: Diagnosis not present

## 2023-02-28 DIAGNOSIS — R0981 Nasal congestion: Secondary | ICD-10-CM | POA: Diagnosis not present

## 2023-02-28 DIAGNOSIS — R059 Cough, unspecified: Secondary | ICD-10-CM | POA: Diagnosis not present

## 2023-03-02 ENCOUNTER — Encounter: Payer: Self-pay | Admitting: Registered"

## 2023-03-02 ENCOUNTER — Encounter: Payer: BC Managed Care – PPO | Attending: Obstetrics & Gynecology | Admitting: Registered"

## 2023-03-02 DIAGNOSIS — Z713 Dietary counseling and surveillance: Secondary | ICD-10-CM | POA: Diagnosis not present
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
## 2023-03-02 NOTE — Patient Instructions (Addendum)
-   Aim to reduce weighing to once a week, Saturdays.   - Aim to reduce exercise to no more than 2 activities/day, unless needed for mental health.   - Check out book No Weigh.   - Aim to have 3 meals a day to include at least 3 food groups. Include a source of carbohydrates and lipid with each meal.

## 2023-03-18 ENCOUNTER — Other Ambulatory Visit (HOSPITAL_BASED_OUTPATIENT_CLINIC_OR_DEPARTMENT_OTHER): Payer: Self-pay | Admitting: Obstetrics & Gynecology

## 2023-03-18 DIAGNOSIS — I872 Venous insufficiency (chronic) (peripheral): Secondary | ICD-10-CM

## 2023-03-18 DIAGNOSIS — I890 Lymphedema, not elsewhere classified: Secondary | ICD-10-CM

## 2023-03-24 ENCOUNTER — Ambulatory Visit: Payer: BC Managed Care – PPO | Attending: Obstetrics & Gynecology

## 2023-03-24 ENCOUNTER — Other Ambulatory Visit: Payer: Self-pay

## 2023-03-24 DIAGNOSIS — Z9889 Other specified postprocedural states: Secondary | ICD-10-CM | POA: Diagnosis not present

## 2023-03-24 DIAGNOSIS — M25562 Pain in left knee: Secondary | ICD-10-CM

## 2023-03-24 DIAGNOSIS — M25561 Pain in right knee: Secondary | ICD-10-CM | POA: Diagnosis not present

## 2023-03-24 DIAGNOSIS — R102 Pelvic and perineal pain: Secondary | ICD-10-CM

## 2023-03-24 DIAGNOSIS — R351 Nocturia: Secondary | ICD-10-CM

## 2023-03-24 DIAGNOSIS — R3915 Urgency of urination: Secondary | ICD-10-CM | POA: Diagnosis not present

## 2023-03-24 DIAGNOSIS — M62838 Other muscle spasm: Secondary | ICD-10-CM | POA: Diagnosis not present

## 2023-03-24 DIAGNOSIS — R109 Unspecified abdominal pain: Secondary | ICD-10-CM | POA: Insufficient documentation

## 2023-03-24 DIAGNOSIS — R279 Unspecified lack of coordination: Secondary | ICD-10-CM | POA: Diagnosis not present

## 2023-03-24 DIAGNOSIS — M6281 Muscle weakness (generalized): Secondary | ICD-10-CM
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
## 2023-03-25 ENCOUNTER — Encounter (HOSPITAL_BASED_OUTPATIENT_CLINIC_OR_DEPARTMENT_OTHER): Payer: Self-pay | Admitting: Obstetrics & Gynecology

## 2023-03-25 ENCOUNTER — Other Ambulatory Visit (HOSPITAL_BASED_OUTPATIENT_CLINIC_OR_DEPARTMENT_OTHER): Payer: Self-pay | Admitting: Obstetrics & Gynecology

## 2023-03-25 DIAGNOSIS — R109 Unspecified abdominal pain: Secondary | ICD-10-CM

## 2023-03-25 DIAGNOSIS — Z9889 Other specified postprocedural states: Secondary | ICD-10-CM

## 2023-03-25 DIAGNOSIS — M6289 Other specified disorders of muscle: Secondary | ICD-10-CM

## 2023-03-25 DIAGNOSIS — G8929 Other chronic pain: Secondary | ICD-10-CM

## 2023-03-25 DIAGNOSIS — R600 Localized edema: Secondary | ICD-10-CM

## 2023-03-25 DIAGNOSIS — R634 Abnormal weight loss: Secondary | ICD-10-CM

## 2023-03-28 DIAGNOSIS — Z713 Dietary counseling and surveillance: Secondary | ICD-10-CM | POA: Diagnosis not present

## 2023-03-29 ENCOUNTER — Ambulatory Visit: Payer: BC Managed Care – PPO

## 2023-03-29 DIAGNOSIS — R279 Unspecified lack of coordination: Secondary | ICD-10-CM

## 2023-03-29 DIAGNOSIS — M62838 Other muscle spasm: Secondary | ICD-10-CM | POA: Diagnosis not present

## 2023-03-29 DIAGNOSIS — M6281 Muscle weakness (generalized): Secondary | ICD-10-CM | POA: Diagnosis not present

## 2023-03-29 DIAGNOSIS — R102 Pelvic and perineal pain: Secondary | ICD-10-CM

## 2023-03-29 DIAGNOSIS — R3915 Urgency of urination: Secondary | ICD-10-CM

## 2023-03-29 DIAGNOSIS — M25561 Pain in right knee: Secondary | ICD-10-CM | POA: Diagnosis not present

## 2023-03-29 DIAGNOSIS — M25562 Pain in left knee: Secondary | ICD-10-CM | POA: Diagnosis not present

## 2023-03-29 DIAGNOSIS — Z9889 Other specified postprocedural states: Secondary | ICD-10-CM | POA: Diagnosis not present

## 2023-03-29 DIAGNOSIS — R351 Nocturia: Secondary | ICD-10-CM | POA: Diagnosis not present

## 2023-03-29 DIAGNOSIS — R109 Unspecified abdominal pain: Secondary | ICD-10-CM | POA: Diagnosis not present
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
## 2023-03-29 NOTE — Patient Instructions (Signed)
   The first picture shows that there is no effect on the pelvic floor with gravity eliminated. The next three show that with a wedge pillow or a few pillows from home under your pelvis the pelvic floor is inverted and may relax and allows gravity to help return prolapsed areas more inward to help relieve symptoms. Do this 15-20 mins every evening when symptoms tend to be worse. Stop if you have pain or negative symptoms.      BREATHING:   Inhale: let your rib cage open like an umbrella and imagine filling up a beach ball with the air that is sitting in your pelvic; place hand on rib cage to help cue your rib cage to open as you breathe in  Exhale: just let the air go, rib cage comes back down, abdomen should get smaller

## 2023-03-30 ENCOUNTER — Ambulatory Visit: Payer: BC Managed Care – PPO

## 2023-03-30 DIAGNOSIS — R109 Unspecified abdominal pain: Secondary | ICD-10-CM | POA: Diagnosis not present

## 2023-03-30 DIAGNOSIS — R279 Unspecified lack of coordination: Secondary | ICD-10-CM | POA: Diagnosis not present

## 2023-03-30 DIAGNOSIS — M62838 Other muscle spasm: Secondary | ICD-10-CM | POA: Diagnosis not present

## 2023-03-30 DIAGNOSIS — R102 Pelvic and perineal pain unspecified side: Secondary | ICD-10-CM

## 2023-03-30 DIAGNOSIS — M25561 Pain in right knee: Secondary | ICD-10-CM

## 2023-03-30 DIAGNOSIS — R351 Nocturia: Secondary | ICD-10-CM | POA: Diagnosis not present

## 2023-03-30 DIAGNOSIS — M25562 Pain in left knee: Secondary | ICD-10-CM

## 2023-03-30 DIAGNOSIS — R3915 Urgency of urination: Secondary | ICD-10-CM

## 2023-03-30 DIAGNOSIS — Z9889 Other specified postprocedural states: Secondary | ICD-10-CM | POA: Diagnosis not present

## 2023-03-30 DIAGNOSIS — M6281 Muscle weakness (generalized): Secondary | ICD-10-CM | POA: Diagnosis not present

## 2023-03-30 NOTE — Patient Instructions (Signed)
Urge Incontinence  Ideal urination frequency is every 2-4 wakeful hours, which equates to 5-8 times within a 24-hour period.   Urge incontinence is leakage that occurs when the bladder muscle contracts, creating a sudden need to go before getting to the bathroom.   Going too often when your bladder isn't actually full can disrupt the body's automatic signals to store and hold urine longer, which will increase urgency/frequency.  In this case, the bladder "is running the show" and strategies can be learned to retrain this pattern.   One should be able to control the first urge to urinate, at around 155m.  The bladder can hold up to a "grande latte," or 409m To help you gain control, practice the Urge Drill below when urgency strikes.  This drill will help retrain your bladder signals and allow you to store and hold urine longer.  The overall goal is to stretch out your time between voids to reach a more manageable voiding schedule.    Practice your "quick flicks" often throughout the day (each waking hour) even when you don't need feel the urge to go.  This will help strengthen your pelvic floor muscles, making them more effective in controlling leakage.  Urge Drill  When you feel an urge to go, follow these steps to regain control: Stop what you are doing and be still Take one deep breath, directing your air into your abdomen Think an affirming thought, such as "I've got this." Do 5 quick flicks of your pelvic floor Walk with control to the bathroom to void, or delay voiding  BrLeg[facility] Surgery[address]SuTotowarLone Rock[place] [id number] # [phone]

## 2023-03-30 NOTE — Addendum Note (Signed)
Addended by: Julio Alm A on: 03/30/2023 11:12 AM   Modules accepted: Orders
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
## 2023-04-04 ENCOUNTER — Ambulatory Visit: Payer: BC Managed Care – PPO

## 2023-04-04 DIAGNOSIS — M62838 Other muscle spasm: Secondary | ICD-10-CM | POA: Diagnosis not present

## 2023-04-04 DIAGNOSIS — R102 Pelvic and perineal pain: Secondary | ICD-10-CM

## 2023-04-04 DIAGNOSIS — M25562 Pain in left knee: Secondary | ICD-10-CM

## 2023-04-04 DIAGNOSIS — R351 Nocturia: Secondary | ICD-10-CM | POA: Diagnosis not present

## 2023-04-04 DIAGNOSIS — M6281 Muscle weakness (generalized): Secondary | ICD-10-CM | POA: Diagnosis not present

## 2023-04-04 DIAGNOSIS — Z9889 Other specified postprocedural states: Secondary | ICD-10-CM | POA: Diagnosis not present

## 2023-04-04 DIAGNOSIS — M25561 Pain in right knee: Secondary | ICD-10-CM | POA: Diagnosis not present

## 2023-04-04 DIAGNOSIS — R279 Unspecified lack of coordination: Secondary | ICD-10-CM | POA: Diagnosis not present

## 2023-04-04 DIAGNOSIS — R3915 Urgency of urination: Secondary | ICD-10-CM

## 2023-04-04 DIAGNOSIS — R109 Unspecified abdominal pain: Secondary | ICD-10-CM | POA: Diagnosis not present
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
## 2023-04-06 ENCOUNTER — Encounter: Payer: Self-pay | Admitting: Physical Therapy

## 2023-04-06 ENCOUNTER — Ambulatory Visit: Payer: BC Managed Care – PPO | Admitting: Physical Therapy

## 2023-04-06 DIAGNOSIS — R351 Nocturia: Secondary | ICD-10-CM

## 2023-04-06 DIAGNOSIS — R102 Pelvic and perineal pain: Secondary | ICD-10-CM

## 2023-04-06 DIAGNOSIS — M25561 Pain in right knee: Secondary | ICD-10-CM

## 2023-04-06 DIAGNOSIS — M62838 Other muscle spasm: Secondary | ICD-10-CM

## 2023-04-06 DIAGNOSIS — R279 Unspecified lack of coordination: Secondary | ICD-10-CM

## 2023-04-06 DIAGNOSIS — M25562 Pain in left knee: Secondary | ICD-10-CM | POA: Diagnosis not present

## 2023-04-06 DIAGNOSIS — Z9889 Other specified postprocedural states: Secondary | ICD-10-CM | POA: Diagnosis not present

## 2023-04-06 DIAGNOSIS — M6281 Muscle weakness (generalized): Secondary | ICD-10-CM | POA: Diagnosis not present

## 2023-04-06 DIAGNOSIS — R109 Unspecified abdominal pain: Secondary | ICD-10-CM | POA: Diagnosis not present

## 2023-04-06 DIAGNOSIS — R3915 Urgency of urination: Secondary | ICD-10-CM
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
## 2023-04-07 ENCOUNTER — Ambulatory Visit: Payer: BC Managed Care – PPO | Attending: Obstetrics & Gynecology

## 2023-04-07 DIAGNOSIS — R351 Nocturia: Secondary | ICD-10-CM | POA: Diagnosis not present

## 2023-04-07 DIAGNOSIS — R102 Pelvic and perineal pain: Secondary | ICD-10-CM | POA: Diagnosis not present

## 2023-04-07 DIAGNOSIS — M62838 Other muscle spasm: Secondary | ICD-10-CM | POA: Insufficient documentation

## 2023-04-07 DIAGNOSIS — R3915 Urgency of urination: Secondary | ICD-10-CM | POA: Diagnosis not present

## 2023-04-07 DIAGNOSIS — R279 Unspecified lack of coordination: Secondary | ICD-10-CM | POA: Diagnosis not present

## 2023-04-07 DIAGNOSIS — M6281 Muscle weakness (generalized): Secondary | ICD-10-CM | POA: Insufficient documentation

## 2023-04-07 DIAGNOSIS — M25562 Pain in left knee: Secondary | ICD-10-CM | POA: Insufficient documentation

## 2023-04-07 DIAGNOSIS — M25561 Pain in right knee: Secondary | ICD-10-CM | POA: Diagnosis not present
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
## 2023-04-08 ENCOUNTER — Ambulatory Visit (INDEPENDENT_AMBULATORY_CARE_PROVIDER_SITE_OTHER): Payer: BC Managed Care – PPO | Admitting: Neurology

## 2023-04-08 ENCOUNTER — Encounter: Payer: Self-pay | Admitting: Neurology

## 2023-04-08 VITALS — BP 128/74 | HR 72 | Ht 62.0 in | Wt 189.8 lb

## 2023-04-08 DIAGNOSIS — G43109 Migraine with aura, not intractable, without status migrainosus: Secondary | ICD-10-CM | POA: Diagnosis not present

## 2023-04-08 DIAGNOSIS — G43409 Hemiplegic migraine, not intractable, without status migrainosus: Secondary | ICD-10-CM

## 2023-04-08 NOTE — Patient Instructions (Addendum)
Ajovy, nortriptyline Nurtec as needed Limit use of pain relievers to no more than 2 days out of week to prevent risk of rebound or medication-overuse headache. Keep headache diary

## 2023-04-11 ENCOUNTER — Ambulatory Visit: Payer: BC Managed Care – PPO

## 2023-04-11 DIAGNOSIS — M62838 Other muscle spasm: Secondary | ICD-10-CM

## 2023-04-11 DIAGNOSIS — R102 Pelvic and perineal pain: Secondary | ICD-10-CM

## 2023-04-11 DIAGNOSIS — M6281 Muscle weakness (generalized): Secondary | ICD-10-CM | POA: Diagnosis not present

## 2023-04-11 DIAGNOSIS — R351 Nocturia: Secondary | ICD-10-CM

## 2023-04-11 DIAGNOSIS — R3915 Urgency of urination: Secondary | ICD-10-CM | POA: Diagnosis not present

## 2023-04-11 DIAGNOSIS — R279 Unspecified lack of coordination: Secondary | ICD-10-CM

## 2023-04-11 DIAGNOSIS — M25562 Pain in left knee: Secondary | ICD-10-CM | POA: Diagnosis not present

## 2023-04-11 DIAGNOSIS — M25561 Pain in right knee: Secondary | ICD-10-CM
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
## 2023-04-13 ENCOUNTER — Encounter: Payer: Self-pay | Admitting: Physical Therapy

## 2023-04-13 ENCOUNTER — Ambulatory Visit: Payer: BC Managed Care – PPO | Admitting: Physical Therapy

## 2023-04-13 DIAGNOSIS — M6281 Muscle weakness (generalized): Secondary | ICD-10-CM | POA: Diagnosis not present

## 2023-04-13 DIAGNOSIS — R351 Nocturia: Secondary | ICD-10-CM | POA: Diagnosis not present

## 2023-04-13 DIAGNOSIS — M25562 Pain in left knee: Secondary | ICD-10-CM

## 2023-04-13 DIAGNOSIS — M62838 Other muscle spasm: Secondary | ICD-10-CM | POA: Diagnosis not present

## 2023-04-13 DIAGNOSIS — R279 Unspecified lack of coordination: Secondary | ICD-10-CM | POA: Diagnosis not present

## 2023-04-13 DIAGNOSIS — R3915 Urgency of urination: Secondary | ICD-10-CM | POA: Diagnosis not present

## 2023-04-13 DIAGNOSIS — R102 Pelvic and perineal pain: Secondary | ICD-10-CM | POA: Diagnosis not present

## 2023-04-13 DIAGNOSIS — M25561 Pain in right knee: Secondary | ICD-10-CM | POA: Diagnosis not present
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
## 2023-04-15 ENCOUNTER — Encounter (HOSPITAL_BASED_OUTPATIENT_CLINIC_OR_DEPARTMENT_OTHER): Payer: Self-pay | Admitting: Physical Therapy

## 2023-04-15 ENCOUNTER — Ambulatory Visit (HOSPITAL_BASED_OUTPATIENT_CLINIC_OR_DEPARTMENT_OTHER): Payer: BC Managed Care – PPO | Attending: Family Medicine | Admitting: Physical Therapy

## 2023-04-15 DIAGNOSIS — M25561 Pain in right knee: Secondary | ICD-10-CM | POA: Diagnosis not present

## 2023-04-15 DIAGNOSIS — R1032 Left lower quadrant pain: Secondary | ICD-10-CM | POA: Insufficient documentation

## 2023-04-15 DIAGNOSIS — R102 Pelvic and perineal pain: Secondary | ICD-10-CM | POA: Diagnosis not present

## 2023-04-15 DIAGNOSIS — Z9889 Other specified postprocedural states: Secondary | ICD-10-CM | POA: Insufficient documentation

## 2023-04-15 DIAGNOSIS — M6281 Muscle weakness (generalized): Secondary | ICD-10-CM | POA: Diagnosis not present

## 2023-04-15 DIAGNOSIS — R293 Abnormal posture: Secondary | ICD-10-CM | POA: Insufficient documentation

## 2023-04-15 DIAGNOSIS — R2689 Other abnormalities of gait and mobility: Secondary | ICD-10-CM | POA: Diagnosis not present

## 2023-04-15 DIAGNOSIS — M25562 Pain in left knee: Secondary | ICD-10-CM | POA: Diagnosis not present
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
## 2023-04-19 ENCOUNTER — Ambulatory Visit: Payer: BC Managed Care – PPO

## 2023-04-19 DIAGNOSIS — M6281 Muscle weakness (generalized): Secondary | ICD-10-CM

## 2023-04-19 DIAGNOSIS — R279 Unspecified lack of coordination: Secondary | ICD-10-CM

## 2023-04-19 DIAGNOSIS — M25562 Pain in left knee: Secondary | ICD-10-CM | POA: Diagnosis not present

## 2023-04-19 DIAGNOSIS — R351 Nocturia: Secondary | ICD-10-CM

## 2023-04-19 DIAGNOSIS — M25561 Pain in right knee: Secondary | ICD-10-CM

## 2023-04-19 DIAGNOSIS — R102 Pelvic and perineal pain: Secondary | ICD-10-CM

## 2023-04-19 DIAGNOSIS — R3915 Urgency of urination: Secondary | ICD-10-CM

## 2023-04-19 DIAGNOSIS — M62838 Other muscle spasm: Secondary | ICD-10-CM
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
## 2023-04-20 ENCOUNTER — Encounter: Payer: Self-pay | Admitting: Physical Therapy

## 2023-04-20 ENCOUNTER — Ambulatory Visit: Payer: BC Managed Care – PPO | Admitting: Physical Therapy

## 2023-04-20 DIAGNOSIS — M62838 Other muscle spasm: Secondary | ICD-10-CM | POA: Diagnosis not present

## 2023-04-20 DIAGNOSIS — M25562 Pain in left knee: Secondary | ICD-10-CM | POA: Diagnosis not present

## 2023-04-20 DIAGNOSIS — R351 Nocturia: Secondary | ICD-10-CM | POA: Diagnosis not present

## 2023-04-20 DIAGNOSIS — R3915 Urgency of urination: Secondary | ICD-10-CM | POA: Diagnosis not present

## 2023-04-20 DIAGNOSIS — R279 Unspecified lack of coordination: Secondary | ICD-10-CM

## 2023-04-20 DIAGNOSIS — R102 Pelvic and perineal pain: Secondary | ICD-10-CM

## 2023-04-20 DIAGNOSIS — M25561 Pain in right knee: Secondary | ICD-10-CM

## 2023-04-20 DIAGNOSIS — M6281 Muscle weakness (generalized): Secondary | ICD-10-CM | POA: Diagnosis not present

## 2023-04-21 ENCOUNTER — Ambulatory Visit: Payer: BC Managed Care – PPO

## 2023-04-21 DIAGNOSIS — R279 Unspecified lack of coordination: Secondary | ICD-10-CM

## 2023-04-21 DIAGNOSIS — R3915 Urgency of urination: Secondary | ICD-10-CM | POA: Diagnosis not present

## 2023-04-21 DIAGNOSIS — M6281 Muscle weakness (generalized): Secondary | ICD-10-CM

## 2023-04-21 DIAGNOSIS — M62838 Other muscle spasm: Secondary | ICD-10-CM | POA: Diagnosis not present

## 2023-04-21 DIAGNOSIS — M25562 Pain in left knee: Secondary | ICD-10-CM | POA: Diagnosis not present

## 2023-04-21 DIAGNOSIS — R351 Nocturia: Secondary | ICD-10-CM

## 2023-04-21 DIAGNOSIS — R102 Pelvic and perineal pain: Secondary | ICD-10-CM | POA: Diagnosis not present

## 2023-04-21 DIAGNOSIS — M25561 Pain in right knee: Secondary | ICD-10-CM | POA: Diagnosis not present
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
## 2023-04-22 ENCOUNTER — Ambulatory Visit (HOSPITAL_BASED_OUTPATIENT_CLINIC_OR_DEPARTMENT_OTHER): Payer: BC Managed Care – PPO | Admitting: Physical Therapy

## 2023-04-22 ENCOUNTER — Encounter (HOSPITAL_BASED_OUTPATIENT_CLINIC_OR_DEPARTMENT_OTHER): Payer: Self-pay | Admitting: Physical Therapy

## 2023-04-22 DIAGNOSIS — M25562 Pain in left knee: Secondary | ICD-10-CM | POA: Diagnosis not present

## 2023-04-22 DIAGNOSIS — R102 Pelvic and perineal pain: Secondary | ICD-10-CM

## 2023-04-22 DIAGNOSIS — M6281 Muscle weakness (generalized): Secondary | ICD-10-CM

## 2023-04-22 DIAGNOSIS — R2689 Other abnormalities of gait and mobility: Secondary | ICD-10-CM | POA: Diagnosis not present

## 2023-04-22 DIAGNOSIS — R1032 Left lower quadrant pain: Secondary | ICD-10-CM | POA: Diagnosis not present

## 2023-04-22 DIAGNOSIS — Z9889 Other specified postprocedural states: Secondary | ICD-10-CM | POA: Diagnosis not present

## 2023-04-22 DIAGNOSIS — M25561 Pain in right knee: Secondary | ICD-10-CM | POA: Diagnosis not present

## 2023-04-22 DIAGNOSIS — R293 Abnormal posture: Secondary | ICD-10-CM | POA: Diagnosis not present
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
## 2023-04-25 ENCOUNTER — Ambulatory Visit: Payer: BC Managed Care – PPO

## 2023-04-25 DIAGNOSIS — R102 Pelvic and perineal pain: Secondary | ICD-10-CM | POA: Diagnosis not present

## 2023-04-25 DIAGNOSIS — M25561 Pain in right knee: Secondary | ICD-10-CM | POA: Diagnosis not present

## 2023-04-25 DIAGNOSIS — R279 Unspecified lack of coordination: Secondary | ICD-10-CM | POA: Diagnosis not present

## 2023-04-25 DIAGNOSIS — M6281 Muscle weakness (generalized): Secondary | ICD-10-CM | POA: Diagnosis not present

## 2023-04-25 DIAGNOSIS — R351 Nocturia: Secondary | ICD-10-CM | POA: Diagnosis not present

## 2023-04-25 DIAGNOSIS — M62838 Other muscle spasm: Secondary | ICD-10-CM

## 2023-04-25 DIAGNOSIS — M25562 Pain in left knee: Secondary | ICD-10-CM | POA: Diagnosis not present

## 2023-04-25 DIAGNOSIS — R3915 Urgency of urination: Secondary | ICD-10-CM | POA: Diagnosis not present
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
## 2023-04-27 DIAGNOSIS — Z1231 Encounter for screening mammogram for malignant neoplasm of breast: Secondary | ICD-10-CM | POA: Diagnosis not present

## 2023-04-28 DIAGNOSIS — Z713 Dietary counseling and surveillance: Secondary | ICD-10-CM | POA: Diagnosis not present

## 2023-04-29 ENCOUNTER — Ambulatory Visit (HOSPITAL_BASED_OUTPATIENT_CLINIC_OR_DEPARTMENT_OTHER): Payer: BC Managed Care – PPO | Admitting: Physical Therapy

## 2023-04-29 ENCOUNTER — Encounter (HOSPITAL_BASED_OUTPATIENT_CLINIC_OR_DEPARTMENT_OTHER): Payer: Self-pay | Admitting: Physical Therapy

## 2023-04-29 DIAGNOSIS — R2689 Other abnormalities of gait and mobility: Secondary | ICD-10-CM | POA: Diagnosis not present

## 2023-04-29 DIAGNOSIS — Z9889 Other specified postprocedural states: Secondary | ICD-10-CM | POA: Diagnosis not present

## 2023-04-29 DIAGNOSIS — M25562 Pain in left knee: Secondary | ICD-10-CM

## 2023-04-29 DIAGNOSIS — R102 Pelvic and perineal pain: Secondary | ICD-10-CM | POA: Diagnosis not present

## 2023-04-29 DIAGNOSIS — M25561 Pain in right knee: Secondary | ICD-10-CM | POA: Diagnosis not present

## 2023-04-29 DIAGNOSIS — M6281 Muscle weakness (generalized): Secondary | ICD-10-CM

## 2023-04-29 DIAGNOSIS — R1032 Left lower quadrant pain: Secondary | ICD-10-CM | POA: Diagnosis not present

## 2023-04-29 DIAGNOSIS — R293 Abnormal posture: Secondary | ICD-10-CM | POA: Diagnosis not present
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
## 2023-05-02 ENCOUNTER — Ambulatory Visit: Payer: BC Managed Care – PPO

## 2023-05-02 DIAGNOSIS — R351 Nocturia: Secondary | ICD-10-CM

## 2023-05-02 DIAGNOSIS — M6281 Muscle weakness (generalized): Secondary | ICD-10-CM

## 2023-05-02 DIAGNOSIS — R102 Pelvic and perineal pain unspecified side: Secondary | ICD-10-CM

## 2023-05-02 DIAGNOSIS — M25561 Pain in right knee: Secondary | ICD-10-CM

## 2023-05-02 DIAGNOSIS — R3915 Urgency of urination: Secondary | ICD-10-CM | POA: Diagnosis not present

## 2023-05-02 DIAGNOSIS — M25562 Pain in left knee: Secondary | ICD-10-CM | POA: Diagnosis not present

## 2023-05-02 DIAGNOSIS — R279 Unspecified lack of coordination: Secondary | ICD-10-CM

## 2023-05-02 DIAGNOSIS — M62838 Other muscle spasm: Secondary | ICD-10-CM
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
## 2023-05-03 ENCOUNTER — Encounter (HOSPITAL_BASED_OUTPATIENT_CLINIC_OR_DEPARTMENT_OTHER): Payer: Self-pay | Admitting: *Deleted

## 2023-05-04 ENCOUNTER — Ambulatory Visit: Payer: BC Managed Care – PPO | Admitting: Physical Therapy

## 2023-05-04 ENCOUNTER — Encounter: Payer: Self-pay | Admitting: Physical Therapy

## 2023-05-04 DIAGNOSIS — R351 Nocturia: Secondary | ICD-10-CM

## 2023-05-04 DIAGNOSIS — R279 Unspecified lack of coordination: Secondary | ICD-10-CM | POA: Diagnosis not present

## 2023-05-04 DIAGNOSIS — M6281 Muscle weakness (generalized): Secondary | ICD-10-CM

## 2023-05-04 DIAGNOSIS — R102 Pelvic and perineal pain unspecified side: Secondary | ICD-10-CM

## 2023-05-04 DIAGNOSIS — M62838 Other muscle spasm: Secondary | ICD-10-CM

## 2023-05-04 DIAGNOSIS — M25561 Pain in right knee: Secondary | ICD-10-CM

## 2023-05-04 DIAGNOSIS — M25562 Pain in left knee: Secondary | ICD-10-CM | POA: Diagnosis not present

## 2023-05-04 DIAGNOSIS — R3915 Urgency of urination: Secondary | ICD-10-CM | POA: Diagnosis not present
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
## 2023-05-05 ENCOUNTER — Ambulatory Visit: Payer: BC Managed Care – PPO

## 2023-05-05 ENCOUNTER — Encounter: Payer: Self-pay | Admitting: Physical Therapy

## 2023-05-05 DIAGNOSIS — R279 Unspecified lack of coordination: Secondary | ICD-10-CM | POA: Diagnosis not present

## 2023-05-05 DIAGNOSIS — R3915 Urgency of urination: Secondary | ICD-10-CM

## 2023-05-05 DIAGNOSIS — M25562 Pain in left knee: Secondary | ICD-10-CM

## 2023-05-05 DIAGNOSIS — M25561 Pain in right knee: Secondary | ICD-10-CM | POA: Diagnosis not present

## 2023-05-05 DIAGNOSIS — M6281 Muscle weakness (generalized): Secondary | ICD-10-CM

## 2023-05-05 DIAGNOSIS — M62838 Other muscle spasm: Secondary | ICD-10-CM

## 2023-05-05 DIAGNOSIS — R351 Nocturia: Secondary | ICD-10-CM | POA: Diagnosis not present

## 2023-05-05 DIAGNOSIS — R102 Pelvic and perineal pain unspecified side: Secondary | ICD-10-CM
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
## 2023-05-06 ENCOUNTER — Ambulatory Visit (HOSPITAL_BASED_OUTPATIENT_CLINIC_OR_DEPARTMENT_OTHER): Payer: BC Managed Care – PPO | Admitting: Physical Therapy

## 2023-05-06 ENCOUNTER — Encounter (HOSPITAL_BASED_OUTPATIENT_CLINIC_OR_DEPARTMENT_OTHER): Payer: Self-pay | Admitting: Physical Therapy

## 2023-05-06 DIAGNOSIS — M25562 Pain in left knee: Secondary | ICD-10-CM

## 2023-05-06 DIAGNOSIS — R293 Abnormal posture: Secondary | ICD-10-CM | POA: Diagnosis not present

## 2023-05-06 DIAGNOSIS — M25561 Pain in right knee: Secondary | ICD-10-CM | POA: Diagnosis not present

## 2023-05-06 DIAGNOSIS — R1032 Left lower quadrant pain: Secondary | ICD-10-CM | POA: Diagnosis not present

## 2023-05-06 DIAGNOSIS — R102 Pelvic and perineal pain: Secondary | ICD-10-CM | POA: Diagnosis not present

## 2023-05-06 DIAGNOSIS — R2689 Other abnormalities of gait and mobility: Secondary | ICD-10-CM | POA: Diagnosis not present

## 2023-05-06 DIAGNOSIS — M6281 Muscle weakness (generalized): Secondary | ICD-10-CM

## 2023-05-06 DIAGNOSIS — Z9889 Other specified postprocedural states: Secondary | ICD-10-CM | POA: Diagnosis not present
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
## 2023-05-11 ENCOUNTER — Encounter: Payer: Self-pay | Admitting: Physical Therapy

## 2023-05-11 ENCOUNTER — Ambulatory Visit: Payer: BC Managed Care – PPO | Attending: Obstetrics & Gynecology | Admitting: Physical Therapy

## 2023-05-11 DIAGNOSIS — R102 Pelvic and perineal pain: Secondary | ICD-10-CM | POA: Diagnosis not present

## 2023-05-11 DIAGNOSIS — R3915 Urgency of urination: Secondary | ICD-10-CM | POA: Insufficient documentation

## 2023-05-11 DIAGNOSIS — M6281 Muscle weakness (generalized): Secondary | ICD-10-CM | POA: Insufficient documentation

## 2023-05-11 DIAGNOSIS — M25561 Pain in right knee: Secondary | ICD-10-CM | POA: Diagnosis not present

## 2023-05-11 DIAGNOSIS — R351 Nocturia: Secondary | ICD-10-CM | POA: Insufficient documentation

## 2023-05-11 DIAGNOSIS — M25562 Pain in left knee: Secondary | ICD-10-CM | POA: Diagnosis not present

## 2023-05-11 DIAGNOSIS — M62838 Other muscle spasm: Secondary | ICD-10-CM | POA: Insufficient documentation

## 2023-05-11 DIAGNOSIS — R279 Unspecified lack of coordination: Secondary | ICD-10-CM | POA: Insufficient documentation

## 2023-05-12 ENCOUNTER — Ambulatory Visit: Payer: BC Managed Care – PPO

## 2023-05-12 DIAGNOSIS — R279 Unspecified lack of coordination: Secondary | ICD-10-CM | POA: Diagnosis not present

## 2023-05-12 DIAGNOSIS — R102 Pelvic and perineal pain: Secondary | ICD-10-CM

## 2023-05-12 DIAGNOSIS — M62838 Other muscle spasm: Secondary | ICD-10-CM

## 2023-05-12 DIAGNOSIS — M25562 Pain in left knee: Secondary | ICD-10-CM

## 2023-05-12 DIAGNOSIS — R3915 Urgency of urination: Secondary | ICD-10-CM | POA: Diagnosis not present

## 2023-05-12 DIAGNOSIS — M6281 Muscle weakness (generalized): Secondary | ICD-10-CM

## 2023-05-12 DIAGNOSIS — M25561 Pain in right knee: Secondary | ICD-10-CM | POA: Diagnosis not present

## 2023-05-12 DIAGNOSIS — R351 Nocturia: Secondary | ICD-10-CM
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
## 2023-05-13 ENCOUNTER — Ambulatory Visit (HOSPITAL_BASED_OUTPATIENT_CLINIC_OR_DEPARTMENT_OTHER): Payer: BC Managed Care – PPO | Attending: Family Medicine | Admitting: Physical Therapy

## 2023-05-13 ENCOUNTER — Encounter (HOSPITAL_BASED_OUTPATIENT_CLINIC_OR_DEPARTMENT_OTHER): Payer: Self-pay | Admitting: Physical Therapy

## 2023-05-13 DIAGNOSIS — M25561 Pain in right knee: Secondary | ICD-10-CM | POA: Insufficient documentation

## 2023-05-13 DIAGNOSIS — R102 Pelvic and perineal pain: Secondary | ICD-10-CM | POA: Insufficient documentation

## 2023-05-13 DIAGNOSIS — M25562 Pain in left knee: Secondary | ICD-10-CM | POA: Insufficient documentation

## 2023-05-13 DIAGNOSIS — M6281 Muscle weakness (generalized): Secondary | ICD-10-CM | POA: Diagnosis not present
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
## 2023-05-18 ENCOUNTER — Ambulatory Visit: Payer: BC Managed Care – PPO

## 2023-05-18 DIAGNOSIS — R102 Pelvic and perineal pain: Secondary | ICD-10-CM | POA: Diagnosis not present

## 2023-05-18 DIAGNOSIS — M25561 Pain in right knee: Secondary | ICD-10-CM | POA: Diagnosis not present

## 2023-05-18 DIAGNOSIS — M6281 Muscle weakness (generalized): Secondary | ICD-10-CM | POA: Diagnosis not present

## 2023-05-18 DIAGNOSIS — R351 Nocturia: Secondary | ICD-10-CM

## 2023-05-18 DIAGNOSIS — M62838 Other muscle spasm: Secondary | ICD-10-CM

## 2023-05-18 DIAGNOSIS — R279 Unspecified lack of coordination: Secondary | ICD-10-CM

## 2023-05-18 DIAGNOSIS — R3915 Urgency of urination: Secondary | ICD-10-CM | POA: Diagnosis not present

## 2023-05-18 DIAGNOSIS — M25562 Pain in left knee: Secondary | ICD-10-CM
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
## 2023-05-19 ENCOUNTER — Ambulatory Visit: Payer: BC Managed Care – PPO

## 2023-05-19 DIAGNOSIS — R279 Unspecified lack of coordination: Secondary | ICD-10-CM | POA: Diagnosis not present

## 2023-05-19 DIAGNOSIS — R351 Nocturia: Secondary | ICD-10-CM

## 2023-05-19 DIAGNOSIS — M6281 Muscle weakness (generalized): Secondary | ICD-10-CM | POA: Diagnosis not present

## 2023-05-19 DIAGNOSIS — M25562 Pain in left knee: Secondary | ICD-10-CM | POA: Diagnosis not present

## 2023-05-19 DIAGNOSIS — M25561 Pain in right knee: Secondary | ICD-10-CM | POA: Diagnosis not present

## 2023-05-19 DIAGNOSIS — R102 Pelvic and perineal pain: Secondary | ICD-10-CM | POA: Diagnosis not present

## 2023-05-19 DIAGNOSIS — R3915 Urgency of urination: Secondary | ICD-10-CM

## 2023-05-19 DIAGNOSIS — M62838 Other muscle spasm: Secondary | ICD-10-CM
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
## 2023-05-20 ENCOUNTER — Encounter (HOSPITAL_BASED_OUTPATIENT_CLINIC_OR_DEPARTMENT_OTHER): Payer: Self-pay | Admitting: Physical Therapy

## 2023-05-20 ENCOUNTER — Ambulatory Visit (HOSPITAL_BASED_OUTPATIENT_CLINIC_OR_DEPARTMENT_OTHER): Payer: BC Managed Care – PPO | Admitting: Physical Therapy

## 2023-05-20 DIAGNOSIS — R102 Pelvic and perineal pain: Secondary | ICD-10-CM | POA: Diagnosis not present

## 2023-05-20 DIAGNOSIS — M6281 Muscle weakness (generalized): Secondary | ICD-10-CM | POA: Diagnosis not present

## 2023-05-20 DIAGNOSIS — M25561 Pain in right knee: Secondary | ICD-10-CM | POA: Diagnosis not present

## 2023-05-20 DIAGNOSIS — M25562 Pain in left knee: Secondary | ICD-10-CM | POA: Diagnosis not present
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
## 2023-05-23 ENCOUNTER — Ambulatory Visit (HOSPITAL_BASED_OUTPATIENT_CLINIC_OR_DEPARTMENT_OTHER): Payer: BC Managed Care – PPO | Admitting: Physical Therapy

## 2023-05-23 ENCOUNTER — Encounter (HOSPITAL_BASED_OUTPATIENT_CLINIC_OR_DEPARTMENT_OTHER): Payer: Self-pay

## 2023-05-23 ENCOUNTER — Encounter (HOSPITAL_BASED_OUTPATIENT_CLINIC_OR_DEPARTMENT_OTHER): Payer: Self-pay | Admitting: Physical Therapy

## 2023-05-23 DIAGNOSIS — R102 Pelvic and perineal pain: Secondary | ICD-10-CM | POA: Diagnosis not present

## 2023-05-23 DIAGNOSIS — M6281 Muscle weakness (generalized): Secondary | ICD-10-CM | POA: Diagnosis not present

## 2023-05-23 DIAGNOSIS — M25561 Pain in right knee: Secondary | ICD-10-CM | POA: Diagnosis not present

## 2023-05-23 DIAGNOSIS — M25562 Pain in left knee: Secondary | ICD-10-CM | POA: Diagnosis not present
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
## 2023-05-25 ENCOUNTER — Encounter: Payer: Self-pay | Admitting: Physical Therapy

## 2023-05-25 ENCOUNTER — Ambulatory Visit: Payer: BC Managed Care – PPO | Admitting: Physical Therapy

## 2023-05-25 DIAGNOSIS — M62838 Other muscle spasm: Secondary | ICD-10-CM

## 2023-05-25 DIAGNOSIS — M6281 Muscle weakness (generalized): Secondary | ICD-10-CM

## 2023-05-25 DIAGNOSIS — M25561 Pain in right knee: Secondary | ICD-10-CM

## 2023-05-25 DIAGNOSIS — R279 Unspecified lack of coordination: Secondary | ICD-10-CM

## 2023-05-25 DIAGNOSIS — R3915 Urgency of urination: Secondary | ICD-10-CM | POA: Diagnosis not present

## 2023-05-25 DIAGNOSIS — R102 Pelvic and perineal pain: Secondary | ICD-10-CM

## 2023-05-25 DIAGNOSIS — M25562 Pain in left knee: Secondary | ICD-10-CM | POA: Diagnosis not present

## 2023-05-25 DIAGNOSIS — R351 Nocturia: Secondary | ICD-10-CM

## 2023-05-26 ENCOUNTER — Ambulatory Visit: Payer: BC Managed Care – PPO

## 2023-05-26 DIAGNOSIS — R3915 Urgency of urination: Secondary | ICD-10-CM

## 2023-05-26 DIAGNOSIS — R102 Pelvic and perineal pain: Secondary | ICD-10-CM | POA: Diagnosis not present

## 2023-05-26 DIAGNOSIS — M62838 Other muscle spasm: Secondary | ICD-10-CM | POA: Diagnosis not present

## 2023-05-26 DIAGNOSIS — M25561 Pain in right knee: Secondary | ICD-10-CM | POA: Diagnosis not present

## 2023-05-26 DIAGNOSIS — Z713 Dietary counseling and surveillance: Secondary | ICD-10-CM | POA: Diagnosis not present

## 2023-05-26 DIAGNOSIS — M25562 Pain in left knee: Secondary | ICD-10-CM | POA: Diagnosis not present

## 2023-05-26 DIAGNOSIS — M6281 Muscle weakness (generalized): Secondary | ICD-10-CM | POA: Diagnosis not present

## 2023-05-26 DIAGNOSIS — R351 Nocturia: Secondary | ICD-10-CM | POA: Diagnosis not present

## 2023-05-26 DIAGNOSIS — R279 Unspecified lack of coordination: Secondary | ICD-10-CM | POA: Diagnosis not present
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
## 2023-05-30 ENCOUNTER — Encounter (HOSPITAL_BASED_OUTPATIENT_CLINIC_OR_DEPARTMENT_OTHER): Payer: Self-pay | Admitting: Physical Therapy

## 2023-05-30 ENCOUNTER — Ambulatory Visit (HOSPITAL_BASED_OUTPATIENT_CLINIC_OR_DEPARTMENT_OTHER): Payer: BC Managed Care – PPO | Admitting: Physical Therapy

## 2023-05-30 DIAGNOSIS — R102 Pelvic and perineal pain: Secondary | ICD-10-CM | POA: Diagnosis not present

## 2023-05-30 DIAGNOSIS — M25561 Pain in right knee: Secondary | ICD-10-CM

## 2023-05-30 DIAGNOSIS — M25562 Pain in left knee: Secondary | ICD-10-CM | POA: Diagnosis not present

## 2023-05-30 DIAGNOSIS — M6281 Muscle weakness (generalized): Secondary | ICD-10-CM | POA: Diagnosis not present
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
## 2023-06-01 ENCOUNTER — Encounter: Payer: Self-pay | Admitting: Physical Therapy

## 2023-06-01 ENCOUNTER — Ambulatory Visit: Payer: BC Managed Care – PPO | Admitting: Physical Therapy

## 2023-06-01 DIAGNOSIS — M62838 Other muscle spasm: Secondary | ICD-10-CM | POA: Diagnosis not present

## 2023-06-01 DIAGNOSIS — M6281 Muscle weakness (generalized): Secondary | ICD-10-CM

## 2023-06-01 DIAGNOSIS — R102 Pelvic and perineal pain unspecified side: Secondary | ICD-10-CM

## 2023-06-01 DIAGNOSIS — M25562 Pain in left knee: Secondary | ICD-10-CM | POA: Diagnosis not present

## 2023-06-01 DIAGNOSIS — R351 Nocturia: Secondary | ICD-10-CM | POA: Diagnosis not present

## 2023-06-01 DIAGNOSIS — R3915 Urgency of urination: Secondary | ICD-10-CM | POA: Diagnosis not present

## 2023-06-01 DIAGNOSIS — R279 Unspecified lack of coordination: Secondary | ICD-10-CM

## 2023-06-01 DIAGNOSIS — M25561 Pain in right knee: Secondary | ICD-10-CM | POA: Diagnosis not present

## 2023-06-02 ENCOUNTER — Ambulatory Visit: Payer: BC Managed Care – PPO

## 2023-06-02 DIAGNOSIS — R102 Pelvic and perineal pain unspecified side: Secondary | ICD-10-CM

## 2023-06-02 DIAGNOSIS — R351 Nocturia: Secondary | ICD-10-CM | POA: Diagnosis not present

## 2023-06-02 DIAGNOSIS — R279 Unspecified lack of coordination: Secondary | ICD-10-CM | POA: Diagnosis not present

## 2023-06-02 DIAGNOSIS — M25561 Pain in right knee: Secondary | ICD-10-CM | POA: Diagnosis not present

## 2023-06-02 DIAGNOSIS — M6281 Muscle weakness (generalized): Secondary | ICD-10-CM | POA: Diagnosis not present

## 2023-06-02 DIAGNOSIS — M62838 Other muscle spasm: Secondary | ICD-10-CM

## 2023-06-02 DIAGNOSIS — R3915 Urgency of urination: Secondary | ICD-10-CM | POA: Diagnosis not present

## 2023-06-02 DIAGNOSIS — M25562 Pain in left knee: Secondary | ICD-10-CM
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
## 2023-06-07 ENCOUNTER — Ambulatory Visit: Payer: BC Managed Care – PPO

## 2023-06-08 ENCOUNTER — Ambulatory Visit: Payer: BC Managed Care – PPO | Attending: Obstetrics & Gynecology | Admitting: Physical Therapy

## 2023-06-08 ENCOUNTER — Encounter: Payer: Self-pay | Admitting: Physical Therapy

## 2023-06-08 DIAGNOSIS — M25562 Pain in left knee: Secondary | ICD-10-CM | POA: Diagnosis not present

## 2023-06-08 DIAGNOSIS — M25561 Pain in right knee: Secondary | ICD-10-CM | POA: Diagnosis not present

## 2023-06-08 DIAGNOSIS — R3915 Urgency of urination: Secondary | ICD-10-CM

## 2023-06-08 DIAGNOSIS — R102 Pelvic and perineal pain unspecified side: Secondary | ICD-10-CM

## 2023-06-08 DIAGNOSIS — M62838 Other muscle spasm: Secondary | ICD-10-CM | POA: Diagnosis not present

## 2023-06-08 DIAGNOSIS — R279 Unspecified lack of coordination: Secondary | ICD-10-CM

## 2023-06-08 DIAGNOSIS — M6281 Muscle weakness (generalized): Secondary | ICD-10-CM

## 2023-06-08 DIAGNOSIS — R351 Nocturia: Secondary | ICD-10-CM
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
## 2023-06-09 ENCOUNTER — Ambulatory Visit: Payer: BC Managed Care – PPO

## 2023-06-09 DIAGNOSIS — M62838 Other muscle spasm: Secondary | ICD-10-CM

## 2023-06-09 DIAGNOSIS — R102 Pelvic and perineal pain: Secondary | ICD-10-CM

## 2023-06-09 DIAGNOSIS — R279 Unspecified lack of coordination: Secondary | ICD-10-CM

## 2023-06-09 DIAGNOSIS — M6281 Muscle weakness (generalized): Secondary | ICD-10-CM | POA: Diagnosis not present

## 2023-06-09 DIAGNOSIS — M25562 Pain in left knee: Secondary | ICD-10-CM

## 2023-06-09 DIAGNOSIS — M25561 Pain in right knee: Secondary | ICD-10-CM | POA: Diagnosis not present

## 2023-06-09 DIAGNOSIS — R351 Nocturia: Secondary | ICD-10-CM | POA: Diagnosis not present

## 2023-06-09 DIAGNOSIS — R3915 Urgency of urination: Secondary | ICD-10-CM | POA: Diagnosis not present
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
## 2023-06-10 ENCOUNTER — Ambulatory Visit: Payer: BC Managed Care – PPO | Admitting: Physical Therapy

## 2023-06-10 ENCOUNTER — Encounter: Payer: Self-pay | Admitting: Physical Therapy

## 2023-06-10 DIAGNOSIS — R279 Unspecified lack of coordination: Secondary | ICD-10-CM

## 2023-06-10 DIAGNOSIS — M25562 Pain in left knee: Secondary | ICD-10-CM | POA: Diagnosis not present

## 2023-06-10 DIAGNOSIS — R351 Nocturia: Secondary | ICD-10-CM

## 2023-06-10 DIAGNOSIS — R3915 Urgency of urination: Secondary | ICD-10-CM

## 2023-06-10 DIAGNOSIS — R102 Pelvic and perineal pain: Secondary | ICD-10-CM

## 2023-06-10 DIAGNOSIS — M6281 Muscle weakness (generalized): Secondary | ICD-10-CM

## 2023-06-10 DIAGNOSIS — M62838 Other muscle spasm: Secondary | ICD-10-CM | POA: Diagnosis not present

## 2023-06-10 DIAGNOSIS — M25561 Pain in right knee: Secondary | ICD-10-CM | POA: Diagnosis not present
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
## 2023-06-14 ENCOUNTER — Ambulatory Visit: Payer: BC Managed Care – PPO

## 2023-06-14 DIAGNOSIS — M25562 Pain in left knee: Secondary | ICD-10-CM

## 2023-06-14 DIAGNOSIS — M6281 Muscle weakness (generalized): Secondary | ICD-10-CM

## 2023-06-14 DIAGNOSIS — R3915 Urgency of urination: Secondary | ICD-10-CM | POA: Diagnosis not present

## 2023-06-14 DIAGNOSIS — R279 Unspecified lack of coordination: Secondary | ICD-10-CM | POA: Diagnosis not present

## 2023-06-14 DIAGNOSIS — M62838 Other muscle spasm: Secondary | ICD-10-CM | POA: Diagnosis not present

## 2023-06-14 DIAGNOSIS — R102 Pelvic and perineal pain: Secondary | ICD-10-CM | POA: Diagnosis not present

## 2023-06-14 DIAGNOSIS — M25561 Pain in right knee: Secondary | ICD-10-CM | POA: Diagnosis not present

## 2023-06-14 DIAGNOSIS — R351 Nocturia: Secondary | ICD-10-CM | POA: Diagnosis not present
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
## 2023-06-15 ENCOUNTER — Ambulatory Visit: Payer: BC Managed Care – PPO | Admitting: Physical Therapy

## 2023-06-15 ENCOUNTER — Encounter: Payer: Self-pay | Admitting: Physical Therapy

## 2023-06-15 DIAGNOSIS — R3915 Urgency of urination: Secondary | ICD-10-CM

## 2023-06-15 DIAGNOSIS — M25562 Pain in left knee: Secondary | ICD-10-CM

## 2023-06-15 DIAGNOSIS — R102 Pelvic and perineal pain: Secondary | ICD-10-CM

## 2023-06-15 DIAGNOSIS — R351 Nocturia: Secondary | ICD-10-CM | POA: Diagnosis not present

## 2023-06-15 DIAGNOSIS — M25561 Pain in right knee: Secondary | ICD-10-CM | POA: Diagnosis not present

## 2023-06-15 DIAGNOSIS — M6281 Muscle weakness (generalized): Secondary | ICD-10-CM

## 2023-06-15 DIAGNOSIS — M62838 Other muscle spasm: Secondary | ICD-10-CM

## 2023-06-15 DIAGNOSIS — R279 Unspecified lack of coordination: Secondary | ICD-10-CM
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
## 2023-06-16 ENCOUNTER — Ambulatory Visit: Payer: BC Managed Care – PPO

## 2023-06-16 DIAGNOSIS — R351 Nocturia: Secondary | ICD-10-CM | POA: Diagnosis not present

## 2023-06-16 DIAGNOSIS — M6281 Muscle weakness (generalized): Secondary | ICD-10-CM

## 2023-06-16 DIAGNOSIS — R279 Unspecified lack of coordination: Secondary | ICD-10-CM | POA: Diagnosis not present

## 2023-06-16 DIAGNOSIS — R102 Pelvic and perineal pain: Secondary | ICD-10-CM

## 2023-06-16 DIAGNOSIS — M62838 Other muscle spasm: Secondary | ICD-10-CM

## 2023-06-16 DIAGNOSIS — R3915 Urgency of urination: Secondary | ICD-10-CM | POA: Diagnosis not present

## 2023-06-16 DIAGNOSIS — M25562 Pain in left knee: Secondary | ICD-10-CM

## 2023-06-16 DIAGNOSIS — M25561 Pain in right knee: Secondary | ICD-10-CM | POA: Diagnosis not present
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
## 2023-06-17 ENCOUNTER — Encounter: Payer: Self-pay | Admitting: Physical Therapy

## 2023-06-17 ENCOUNTER — Ambulatory Visit: Payer: BC Managed Care – PPO | Admitting: Physical Therapy

## 2023-06-17 DIAGNOSIS — R102 Pelvic and perineal pain unspecified side: Secondary | ICD-10-CM

## 2023-06-17 DIAGNOSIS — R3915 Urgency of urination: Secondary | ICD-10-CM

## 2023-06-17 DIAGNOSIS — M25561 Pain in right knee: Secondary | ICD-10-CM | POA: Diagnosis not present

## 2023-06-17 DIAGNOSIS — M6281 Muscle weakness (generalized): Secondary | ICD-10-CM

## 2023-06-17 DIAGNOSIS — R279 Unspecified lack of coordination: Secondary | ICD-10-CM | POA: Diagnosis not present

## 2023-06-17 DIAGNOSIS — M25562 Pain in left knee: Secondary | ICD-10-CM

## 2023-06-17 DIAGNOSIS — R351 Nocturia: Secondary | ICD-10-CM

## 2023-06-17 DIAGNOSIS — M62838 Other muscle spasm: Secondary | ICD-10-CM
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
## 2023-06-21 ENCOUNTER — Ambulatory Visit: Payer: BC Managed Care – PPO

## 2023-06-21 DIAGNOSIS — M6281 Muscle weakness (generalized): Secondary | ICD-10-CM | POA: Diagnosis not present

## 2023-06-21 DIAGNOSIS — R3915 Urgency of urination: Secondary | ICD-10-CM | POA: Diagnosis not present

## 2023-06-21 DIAGNOSIS — R102 Pelvic and perineal pain: Secondary | ICD-10-CM

## 2023-06-21 DIAGNOSIS — M1711 Unilateral primary osteoarthritis, right knee: Secondary | ICD-10-CM | POA: Diagnosis not present

## 2023-06-21 DIAGNOSIS — R351 Nocturia: Secondary | ICD-10-CM | POA: Diagnosis not present

## 2023-06-21 DIAGNOSIS — M25562 Pain in left knee: Secondary | ICD-10-CM | POA: Diagnosis not present

## 2023-06-21 DIAGNOSIS — R279 Unspecified lack of coordination: Secondary | ICD-10-CM | POA: Diagnosis not present

## 2023-06-21 DIAGNOSIS — M1712 Unilateral primary osteoarthritis, left knee: Secondary | ICD-10-CM | POA: Diagnosis not present

## 2023-06-21 DIAGNOSIS — M25561 Pain in right knee: Secondary | ICD-10-CM | POA: Diagnosis not present

## 2023-06-21 DIAGNOSIS — M62838 Other muscle spasm: Secondary | ICD-10-CM
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
## 2023-06-22 ENCOUNTER — Ambulatory Visit: Payer: BC Managed Care – PPO | Admitting: Physical Therapy

## 2023-06-22 ENCOUNTER — Ambulatory Visit (INDEPENDENT_AMBULATORY_CARE_PROVIDER_SITE_OTHER): Payer: BC Managed Care – PPO | Admitting: Cardiology

## 2023-06-22 ENCOUNTER — Encounter (HOSPITAL_BASED_OUTPATIENT_CLINIC_OR_DEPARTMENT_OTHER): Payer: Self-pay | Admitting: Cardiology

## 2023-06-22 ENCOUNTER — Encounter: Payer: Self-pay | Admitting: Physical Therapy

## 2023-06-22 VITALS — BP 120/56 | HR 59 | Ht 62.0 in | Wt 198.0 lb

## 2023-06-22 DIAGNOSIS — Z7189 Other specified counseling: Secondary | ICD-10-CM

## 2023-06-22 DIAGNOSIS — R3915 Urgency of urination: Secondary | ICD-10-CM | POA: Diagnosis not present

## 2023-06-22 DIAGNOSIS — R279 Unspecified lack of coordination: Secondary | ICD-10-CM | POA: Diagnosis not present

## 2023-06-22 DIAGNOSIS — R079 Chest pain, unspecified: Secondary | ICD-10-CM

## 2023-06-22 DIAGNOSIS — R351 Nocturia: Secondary | ICD-10-CM | POA: Diagnosis not present

## 2023-06-22 DIAGNOSIS — M62838 Other muscle spasm: Secondary | ICD-10-CM | POA: Diagnosis not present

## 2023-06-22 DIAGNOSIS — M6281 Muscle weakness (generalized): Secondary | ICD-10-CM | POA: Diagnosis not present

## 2023-06-22 DIAGNOSIS — R072 Precordial pain: Secondary | ICD-10-CM | POA: Diagnosis not present

## 2023-06-22 DIAGNOSIS — R102 Pelvic and perineal pain unspecified side: Secondary | ICD-10-CM

## 2023-06-22 DIAGNOSIS — I872 Venous insufficiency (chronic) (peripheral): Secondary | ICD-10-CM

## 2023-06-22 DIAGNOSIS — M25561 Pain in right knee: Secondary | ICD-10-CM

## 2023-06-22 DIAGNOSIS — M25562 Pain in left knee: Secondary | ICD-10-CM
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
## 2023-06-22 NOTE — Progress Notes (Signed)
Cardiology Office Note:  .   Date:  06/22/2023  ID:  [name], DOB Oct 08, 1975, MRN [id number] PCP: Camie Patience, FNP   HeartCare Providers Cardiologist:  None {  History of Present Illness: .   [name] is a 47 y.o. female with PMH morbid obesity (peak weight 340 lbs) s/p weight loss, chest pain, LE edema referred to cardiology for further evaluation of the above.  Today: Her main concern is a history of complicated migraines, which can resemble strokes. Can have syncope. Has trialed multiple meds, many have side effects. She has recently been having chest pressure with her new migraine medication.  Chest pressure started several months ago. Mixed dull/sharp pain in center of chest, does not radiate. Decreased with palpation but does not go away. Sometimes feels short of breath with it, improves with deep breathing. Happens daily, lasts a few minutes. Not severe.  Has intermittent LE edema, has seen the vein team. Prior venous dopplers on left side without clot, venous reflux noted in left sapheno-femoral junction. Reviewed notes from Dr. Lenell Antu, felt to be related to chronic venous/lymphatic insufficiency.  Mother had complications around the time of patient's birth, was told she was close to having a stroke. Grandmother has stents. Multiple family members with high blood pressure.  Had calcium eval on her mammogram, normal.   Peak weight was 340 lbs, lost 170 lbs on her own. Works out regularly, just came from [employer].  ROS: Denies chest pain, shortness of breath at rest or with normal exertion. No PND, orthopnea, LE edema or unexpected weight gain. No syncope or palpitations. ROS otherwise negative except as noted.   Studies Reviewed: [name]    EKG:       Physical Exam:   VS:  BP (!) 120/56 (BP Location: Left Arm, Patient Position: Sitting, Cuff Size: Normal)   Pulse (!) 59   Ht 5\' 2"  (1.575 m)   Wt 198 lb (89.8 kg)   LMP 09/06/2018 (Approximate)   SpO2  97%   BMI 36.21 kg/m    Wt Readings from Last 3 Encounters:  06/22/23 198 lb (89.8 kg)  04/08/23 189 lb 12.8 oz (86.1 kg)  01/19/23 184 lb (83.5 kg)    GEN: Well nourished, well developed in no acute distress HEENT: Normal, moist mucous membranes NECK: No JVD CARDIAC: regular rhythm, normal S1 and S2, no rubs or gallops. No murmur. VASCULAR: Radial and DP pulses 2+ bilaterally. No carotid bruits RESPIRATORY:  Clear to auscultation without rales, wheezing or rhonchi  ABDOMEN: Soft, non-tender, non-distended MUSCULOSKELETAL:  Ambulates independently SKIN: Warm and dry, no edema NEUROLOGIC:  Alert and oriented x 3. No focal neuro deficits noted. PSYCHIATRIC:  Normal affect    ASSESSMENT AND PLAN: .    Chest pressure -discussed treadmill stress, nuclear stress/lexiscan, and CT coronary angiography. Discussed pros and cons of each, including but not limited to false positive/false negative risk, radiation risk, and risk of IV contrast dye. Based on shared decision making, decision was made to pursue CT coronary angiography. -has baseline sinus bradycardia, will hold on beta blocker -counseled on need to get BMET prior to test -counseled on use of sublingual nitroglycerin and its importance to a good test -reviewed red flag warning signs that need immediate medical attention  -wants to avoid meds as much as possible  UPDATED TO ADD: CT coronary with calcium score of 0, no evidence of CAD, very reassuring  LE edema -has been seen by Dr. Lenell Antu, thought to be chronic  venous insufficiency vs. Lymph insufficiency -counseled on salt avoidance, compression, elevation  CV risk counseling and prevention -recommend heart healthy/Mediterranean diet, with whole grains, fruits, vegetable, fish, lean meats, nuts, and olive oil. Limit salt. -recommend moderate walking, 3-5 times/week for 30-50 minutes each session. Aim for at least 150 minutes.week. Goal should be pace of 3 miles/hours, or walking  1.5 miles in 30 minutes -recommend avoidance of tobacco products. Avoid excess alcohol. -ASCVD risk score: The ASCVD Risk score (Arnett DK, et al., 2019) failed to calculate for the following reasons:   The valid HDL cholesterol range is 20 to 100 mg/dL    Dispo: 3 mos or sooner based on results of testing  Signed, Jodelle Red, MD   Jodelle Red, MD, PhD, [facility]  [facility]  [place]  [facility] V[facility] at [facility] at [facility] [address] [place], [place] [address] [phone]

## 2023-06-22 NOTE — Patient Instructions (Addendum)
Medication Instructions:  NO CHANGES *If you need a refill on your cardiac medications before your next appointment, please call your pharmacy*   Lab Work: BMET  If you have labs (blood work) drawn today and your tests are completely normal, you will receive your results only by: MyChart Message (if you have MyChart) OR A paper copy in the mail If you have any lab test that is abnormal or we need to change your treatment, we will call you to review the results.   Testing/Procedures:   Your cardiac CT will be scheduled at one of the below locations:   [facility] [address] [place], [place] [address] [phone]  If scheduled at [facility], please arrive at the [facility] and Children's Entrance (Entrance C2) of [facility] 30 minutes prior to test start time. You can use the FREE valet parking offered at entrance C (encouraged to control the heart rate for the test)  Proceed to the [facility] Radiology Department (first floor) to check-in and test prep.  All radiology patients and guests should use entrance C2 at [facility], accessed from [facility], even though the hospital's physical address listed is [address]   Please follow these instructions carefully (unless otherwise directed):  An IV will be required for this test and Nitroglycerin will be given.  Hold all erectile dysfunction medications at least 3 days (72 hrs) prior to test. (Ie viagra, cialis, sildenafil, tadalafil, etc)   On the Night Before the Test: Be sure to Drink plenty of water. Do not consume any caffeinated/decaffeinated beverages or chocolate 12 hours prior to your test. Do not take any antihistamines 12 hours prior to your test. On the Day of the Test: Drink plenty of water until 1 hour prior to the test. Do not eat any food 1 hour prior to test. You may take your regular medications prior to the test.  FEMALES- please wear  underwire-free bra if available, avoid dresses & tight clothing  *After the Test: Drink plenty of water. After receiving IV contrast, you may experience a mild flushed feeling. This is normal. On occasion, you may experience a mild rash up to 24 hours after the test. This is not dangerous. If this occurs, you can take Benadryl 25 mg and increase your fluid intake. If you experience trouble breathing, this can be serious. If it is severe call 911 IMMEDIATELY. If it is mild, please call our office. If you take any of these medications: Glipizide/Metformin, Avandament, Glucavance, please do not take 48 hours after completing test unless otherwise instructed.  We will call to schedule your test 2-4 weeks out understanding that some insurance companies will need an authorization prior to the service being performed.   For more information and frequently asked questions, please visit our website : [email or web address]  For non-scheduling related questions, please contact the cardiac imaging nurse navigator should you have any questions/concerns: Cardiac Imaging Nurse Navigators Direct Office Dial: [phone]   For scheduling needs, including cancellations and rescheduling, please call [place], [phone].    Follow-Up: At [facility], you and your health needs are our priority.  As part of our continuing mission to provide you with exceptional heart care, we have created designated Provider Care Teams.  These Care Teams include your primary Cardiologist (physician) and Advanced Practice Providers (APPs -  Physician Assistants and Nurse Practitioners) who all work together to provide you with the care you need, when you need  it.  We recommend signing up for the patient portal called "MyChart".  Sign up information is provided on this After Visit Summary.  MyChart is used to connect with patients for Virtual Visits (Telemedicine).  Patients are able to view lab/test  results, encounter notes, upcoming appointments, etc.  Non-urgent messages can be sent to your provider as well.   To learn more about what you can do with MyChart, go to [email or web address].    Your next appointment:   3 month(s)  Provider:   Jodelle Red, MD    Other Instructions NONE

## 2023-06-23 LAB — BASIC METABOLIC PANEL
BUN/Creatinine Ratio: 16 (ref 9–23)
BUN: 13 mg/dL (ref 6–24)
CO2: 25 mmol/L (ref 20–29)
Calcium: 9.2 mg/dL (ref 8.7–10.2)
Chloride: 105 mmol/L (ref 96–106)
Creatinine, Ser: 0.8 mg/dL (ref 0.57–1.00)
Glucose: 94 mg/dL (ref 70–99)
Potassium: 3.9 mmol/L (ref 3.5–5.2)
Sodium: 144 mmol/L (ref 134–144)
eGFR: 92 mL/min/{1.73_m2} (ref >59)

## 2023-06-24 ENCOUNTER — Encounter: Payer: Self-pay | Admitting: Physical Therapy

## 2023-06-24 ENCOUNTER — Ambulatory Visit: Payer: BC Managed Care – PPO | Admitting: Physical Therapy

## 2023-06-24 DIAGNOSIS — M6281 Muscle weakness (generalized): Secondary | ICD-10-CM | POA: Diagnosis not present

## 2023-06-24 DIAGNOSIS — M25562 Pain in left knee: Secondary | ICD-10-CM | POA: Diagnosis not present

## 2023-06-24 DIAGNOSIS — R102 Pelvic and perineal pain: Secondary | ICD-10-CM

## 2023-06-24 DIAGNOSIS — M62838 Other muscle spasm: Secondary | ICD-10-CM

## 2023-06-24 DIAGNOSIS — R3915 Urgency of urination: Secondary | ICD-10-CM

## 2023-06-24 DIAGNOSIS — M25561 Pain in right knee: Secondary | ICD-10-CM | POA: Diagnosis not present

## 2023-06-24 DIAGNOSIS — Z713 Dietary counseling and surveillance: Secondary | ICD-10-CM | POA: Diagnosis not present

## 2023-06-24 DIAGNOSIS — R279 Unspecified lack of coordination: Secondary | ICD-10-CM | POA: Diagnosis not present

## 2023-06-24 DIAGNOSIS — R351 Nocturia: Secondary | ICD-10-CM
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
## 2023-06-28 ENCOUNTER — Ambulatory Visit: Payer: BC Managed Care – PPO

## 2023-06-28 DIAGNOSIS — R279 Unspecified lack of coordination: Secondary | ICD-10-CM | POA: Diagnosis not present

## 2023-06-28 DIAGNOSIS — M62838 Other muscle spasm: Secondary | ICD-10-CM

## 2023-06-28 DIAGNOSIS — R351 Nocturia: Secondary | ICD-10-CM | POA: Diagnosis not present

## 2023-06-28 DIAGNOSIS — R102 Pelvic and perineal pain: Secondary | ICD-10-CM | POA: Diagnosis not present

## 2023-06-28 DIAGNOSIS — R3915 Urgency of urination: Secondary | ICD-10-CM | POA: Diagnosis not present

## 2023-06-28 DIAGNOSIS — M6281 Muscle weakness (generalized): Secondary | ICD-10-CM | POA: Diagnosis not present

## 2023-06-28 DIAGNOSIS — M25562 Pain in left knee: Secondary | ICD-10-CM | POA: Diagnosis not present

## 2023-06-28 DIAGNOSIS — M25561 Pain in right knee: Secondary | ICD-10-CM | POA: Diagnosis not present
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
## 2023-06-29 ENCOUNTER — Ambulatory Visit: Payer: BC Managed Care – PPO | Admitting: Physical Therapy

## 2023-06-29 ENCOUNTER — Encounter: Payer: Self-pay | Admitting: Physical Therapy

## 2023-06-29 DIAGNOSIS — R102 Pelvic and perineal pain unspecified side: Secondary | ICD-10-CM

## 2023-06-29 DIAGNOSIS — M25561 Pain in right knee: Secondary | ICD-10-CM

## 2023-06-29 DIAGNOSIS — R351 Nocturia: Secondary | ICD-10-CM | POA: Diagnosis not present

## 2023-06-29 DIAGNOSIS — M62838 Other muscle spasm: Secondary | ICD-10-CM | POA: Diagnosis not present

## 2023-06-29 DIAGNOSIS — R279 Unspecified lack of coordination: Secondary | ICD-10-CM

## 2023-06-29 DIAGNOSIS — M6281 Muscle weakness (generalized): Secondary | ICD-10-CM | POA: Diagnosis not present

## 2023-06-29 DIAGNOSIS — M25562 Pain in left knee: Secondary | ICD-10-CM | POA: Diagnosis not present

## 2023-06-29 DIAGNOSIS — R3915 Urgency of urination: Secondary | ICD-10-CM | POA: Diagnosis not present

## 2023-06-30 ENCOUNTER — Ambulatory Visit: Payer: BC Managed Care – PPO

## 2023-06-30 DIAGNOSIS — R279 Unspecified lack of coordination: Secondary | ICD-10-CM

## 2023-06-30 DIAGNOSIS — R102 Pelvic and perineal pain: Secondary | ICD-10-CM

## 2023-06-30 DIAGNOSIS — M6281 Muscle weakness (generalized): Secondary | ICD-10-CM | POA: Diagnosis not present

## 2023-06-30 DIAGNOSIS — M62838 Other muscle spasm: Secondary | ICD-10-CM

## 2023-06-30 DIAGNOSIS — M25562 Pain in left knee: Secondary | ICD-10-CM | POA: Diagnosis not present

## 2023-06-30 DIAGNOSIS — M25561 Pain in right knee: Secondary | ICD-10-CM | POA: Diagnosis not present

## 2023-06-30 DIAGNOSIS — R3915 Urgency of urination: Secondary | ICD-10-CM | POA: Diagnosis not present

## 2023-06-30 DIAGNOSIS — R351 Nocturia: Secondary | ICD-10-CM | POA: Diagnosis not present
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
## 2023-07-01 ENCOUNTER — Encounter (HOSPITAL_BASED_OUTPATIENT_CLINIC_OR_DEPARTMENT_OTHER): Payer: Self-pay

## 2023-07-01 ENCOUNTER — Encounter: Payer: Self-pay | Admitting: Physical Therapy

## 2023-07-01 ENCOUNTER — Ambulatory Visit: Payer: BC Managed Care – PPO | Admitting: Physical Therapy

## 2023-07-01 DIAGNOSIS — M25562 Pain in left knee: Secondary | ICD-10-CM | POA: Diagnosis not present

## 2023-07-01 DIAGNOSIS — R279 Unspecified lack of coordination: Secondary | ICD-10-CM | POA: Diagnosis not present

## 2023-07-01 DIAGNOSIS — R351 Nocturia: Secondary | ICD-10-CM | POA: Diagnosis not present

## 2023-07-01 DIAGNOSIS — M62838 Other muscle spasm: Secondary | ICD-10-CM

## 2023-07-01 DIAGNOSIS — M25561 Pain in right knee: Secondary | ICD-10-CM | POA: Diagnosis not present

## 2023-07-01 DIAGNOSIS — R102 Pelvic and perineal pain: Secondary | ICD-10-CM

## 2023-07-01 DIAGNOSIS — M6281 Muscle weakness (generalized): Secondary | ICD-10-CM | POA: Diagnosis not present

## 2023-07-01 DIAGNOSIS — R3915 Urgency of urination: Secondary | ICD-10-CM | POA: Diagnosis not present
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
## 2023-07-05 ENCOUNTER — Ambulatory Visit: Payer: BC Managed Care – PPO

## 2023-07-05 DIAGNOSIS — R3915 Urgency of urination: Secondary | ICD-10-CM | POA: Diagnosis not present

## 2023-07-05 DIAGNOSIS — R102 Pelvic and perineal pain: Secondary | ICD-10-CM | POA: Diagnosis not present

## 2023-07-05 DIAGNOSIS — M25562 Pain in left knee: Secondary | ICD-10-CM

## 2023-07-05 DIAGNOSIS — R279 Unspecified lack of coordination: Secondary | ICD-10-CM

## 2023-07-05 DIAGNOSIS — M25561 Pain in right knee: Secondary | ICD-10-CM | POA: Diagnosis not present

## 2023-07-05 DIAGNOSIS — M6281 Muscle weakness (generalized): Secondary | ICD-10-CM | POA: Diagnosis not present

## 2023-07-05 DIAGNOSIS — M62838 Other muscle spasm: Secondary | ICD-10-CM | POA: Diagnosis not present

## 2023-07-05 DIAGNOSIS — R351 Nocturia: Secondary | ICD-10-CM | POA: Diagnosis not present
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
## 2023-07-06 ENCOUNTER — Ambulatory Visit: Payer: BC Managed Care – PPO | Admitting: Physical Therapy

## 2023-07-06 ENCOUNTER — Encounter: Payer: Self-pay | Admitting: Physical Therapy

## 2023-07-06 DIAGNOSIS — M25562 Pain in left knee: Secondary | ICD-10-CM | POA: Diagnosis not present

## 2023-07-06 DIAGNOSIS — M6281 Muscle weakness (generalized): Secondary | ICD-10-CM

## 2023-07-06 DIAGNOSIS — R3915 Urgency of urination: Secondary | ICD-10-CM | POA: Diagnosis not present

## 2023-07-06 DIAGNOSIS — R102 Pelvic and perineal pain unspecified side: Secondary | ICD-10-CM

## 2023-07-06 DIAGNOSIS — R279 Unspecified lack of coordination: Secondary | ICD-10-CM | POA: Diagnosis not present

## 2023-07-06 DIAGNOSIS — M25561 Pain in right knee: Secondary | ICD-10-CM | POA: Diagnosis not present

## 2023-07-06 DIAGNOSIS — R351 Nocturia: Secondary | ICD-10-CM | POA: Diagnosis not present

## 2023-07-06 DIAGNOSIS — M62838 Other muscle spasm: Secondary | ICD-10-CM | POA: Diagnosis not present

## 2023-07-07 ENCOUNTER — Ambulatory Visit: Payer: BC Managed Care – PPO

## 2023-07-07 DIAGNOSIS — R351 Nocturia: Secondary | ICD-10-CM | POA: Diagnosis not present

## 2023-07-07 DIAGNOSIS — M6281 Muscle weakness (generalized): Secondary | ICD-10-CM | POA: Diagnosis not present

## 2023-07-07 DIAGNOSIS — R102 Pelvic and perineal pain: Secondary | ICD-10-CM

## 2023-07-07 DIAGNOSIS — M25561 Pain in right knee: Secondary | ICD-10-CM

## 2023-07-07 DIAGNOSIS — R3915 Urgency of urination: Secondary | ICD-10-CM | POA: Diagnosis not present

## 2023-07-07 DIAGNOSIS — R279 Unspecified lack of coordination: Secondary | ICD-10-CM | POA: Diagnosis not present

## 2023-07-07 DIAGNOSIS — M62838 Other muscle spasm: Secondary | ICD-10-CM

## 2023-07-07 DIAGNOSIS — M25562 Pain in left knee: Secondary | ICD-10-CM | POA: Diagnosis not present
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
## 2023-07-08 ENCOUNTER — Ambulatory Visit: Payer: BC Managed Care – PPO | Attending: Obstetrics & Gynecology | Admitting: Physical Therapy

## 2023-07-08 ENCOUNTER — Encounter: Payer: Self-pay | Admitting: Physical Therapy

## 2023-07-08 DIAGNOSIS — R102 Pelvic and perineal pain: Secondary | ICD-10-CM | POA: Diagnosis not present

## 2023-07-08 DIAGNOSIS — M62838 Other muscle spasm: Secondary | ICD-10-CM | POA: Diagnosis not present

## 2023-07-08 DIAGNOSIS — R351 Nocturia: Secondary | ICD-10-CM | POA: Insufficient documentation

## 2023-07-08 DIAGNOSIS — M25561 Pain in right knee: Secondary | ICD-10-CM | POA: Diagnosis not present

## 2023-07-08 DIAGNOSIS — R279 Unspecified lack of coordination: Secondary | ICD-10-CM | POA: Diagnosis not present

## 2023-07-08 DIAGNOSIS — R3915 Urgency of urination: Secondary | ICD-10-CM | POA: Insufficient documentation

## 2023-07-08 DIAGNOSIS — M6281 Muscle weakness (generalized): Secondary | ICD-10-CM | POA: Insufficient documentation

## 2023-07-08 DIAGNOSIS — M25562 Pain in left knee: Secondary | ICD-10-CM | POA: Diagnosis not present
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
## 2023-07-12 ENCOUNTER — Ambulatory Visit: Payer: BC Managed Care – PPO

## 2023-07-12 DIAGNOSIS — R102 Pelvic and perineal pain: Secondary | ICD-10-CM

## 2023-07-12 DIAGNOSIS — M62838 Other muscle spasm: Secondary | ICD-10-CM | POA: Diagnosis not present

## 2023-07-12 DIAGNOSIS — M25561 Pain in right knee: Secondary | ICD-10-CM | POA: Diagnosis not present

## 2023-07-12 DIAGNOSIS — R351 Nocturia: Secondary | ICD-10-CM | POA: Diagnosis not present

## 2023-07-12 DIAGNOSIS — R3915 Urgency of urination: Secondary | ICD-10-CM | POA: Diagnosis not present

## 2023-07-12 DIAGNOSIS — M6281 Muscle weakness (generalized): Secondary | ICD-10-CM | POA: Diagnosis not present

## 2023-07-12 DIAGNOSIS — M25562 Pain in left knee: Secondary | ICD-10-CM | POA: Diagnosis not present

## 2023-07-12 DIAGNOSIS — R279 Unspecified lack of coordination: Secondary | ICD-10-CM | POA: Diagnosis not present
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
## 2023-07-13 ENCOUNTER — Ambulatory Visit: Payer: BC Managed Care – PPO | Admitting: Physical Therapy

## 2023-07-13 ENCOUNTER — Encounter: Payer: Self-pay | Admitting: Physical Therapy

## 2023-07-13 DIAGNOSIS — M25561 Pain in right knee: Secondary | ICD-10-CM

## 2023-07-13 DIAGNOSIS — M25562 Pain in left knee: Secondary | ICD-10-CM | POA: Diagnosis not present

## 2023-07-13 DIAGNOSIS — R279 Unspecified lack of coordination: Secondary | ICD-10-CM

## 2023-07-13 DIAGNOSIS — M62838 Other muscle spasm: Secondary | ICD-10-CM

## 2023-07-13 DIAGNOSIS — R3915 Urgency of urination: Secondary | ICD-10-CM | POA: Diagnosis not present

## 2023-07-13 DIAGNOSIS — M6281 Muscle weakness (generalized): Secondary | ICD-10-CM

## 2023-07-13 DIAGNOSIS — R351 Nocturia: Secondary | ICD-10-CM

## 2023-07-13 DIAGNOSIS — R102 Pelvic and perineal pain: Secondary | ICD-10-CM
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
## 2023-07-14 ENCOUNTER — Ambulatory Visit: Payer: BC Managed Care – PPO

## 2023-07-14 DIAGNOSIS — M25562 Pain in left knee: Secondary | ICD-10-CM | POA: Diagnosis not present

## 2023-07-14 DIAGNOSIS — M6281 Muscle weakness (generalized): Secondary | ICD-10-CM

## 2023-07-14 DIAGNOSIS — R102 Pelvic and perineal pain: Secondary | ICD-10-CM | POA: Diagnosis not present

## 2023-07-14 DIAGNOSIS — R279 Unspecified lack of coordination: Secondary | ICD-10-CM

## 2023-07-14 DIAGNOSIS — M62838 Other muscle spasm: Secondary | ICD-10-CM

## 2023-07-14 DIAGNOSIS — R351 Nocturia: Secondary | ICD-10-CM | POA: Diagnosis not present

## 2023-07-14 DIAGNOSIS — M25561 Pain in right knee: Secondary | ICD-10-CM | POA: Diagnosis not present

## 2023-07-14 DIAGNOSIS — R3915 Urgency of urination: Secondary | ICD-10-CM | POA: Diagnosis not present
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
## 2023-07-15 ENCOUNTER — Ambulatory Visit: Payer: BC Managed Care – PPO | Admitting: Physical Therapy

## 2023-07-15 ENCOUNTER — Encounter: Payer: Self-pay | Admitting: Physical Therapy

## 2023-07-15 DIAGNOSIS — R351 Nocturia: Secondary | ICD-10-CM | POA: Diagnosis not present

## 2023-07-15 DIAGNOSIS — M62838 Other muscle spasm: Secondary | ICD-10-CM | POA: Diagnosis not present

## 2023-07-15 DIAGNOSIS — R102 Pelvic and perineal pain unspecified side: Secondary | ICD-10-CM

## 2023-07-15 DIAGNOSIS — M6281 Muscle weakness (generalized): Secondary | ICD-10-CM | POA: Diagnosis not present

## 2023-07-15 DIAGNOSIS — M25561 Pain in right knee: Secondary | ICD-10-CM | POA: Diagnosis not present

## 2023-07-15 DIAGNOSIS — R3915 Urgency of urination: Secondary | ICD-10-CM

## 2023-07-15 DIAGNOSIS — R279 Unspecified lack of coordination: Secondary | ICD-10-CM

## 2023-07-15 DIAGNOSIS — M25562 Pain in left knee: Secondary | ICD-10-CM
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
## 2023-07-19 ENCOUNTER — Ambulatory Visit: Payer: BC Managed Care – PPO

## 2023-07-19 DIAGNOSIS — M62838 Other muscle spasm: Secondary | ICD-10-CM | POA: Diagnosis not present

## 2023-07-19 DIAGNOSIS — R279 Unspecified lack of coordination: Secondary | ICD-10-CM

## 2023-07-19 DIAGNOSIS — R3915 Urgency of urination: Secondary | ICD-10-CM | POA: Diagnosis not present

## 2023-07-19 DIAGNOSIS — R102 Pelvic and perineal pain: Secondary | ICD-10-CM

## 2023-07-19 DIAGNOSIS — R351 Nocturia: Secondary | ICD-10-CM | POA: Diagnosis not present

## 2023-07-19 DIAGNOSIS — M25562 Pain in left knee: Secondary | ICD-10-CM | POA: Diagnosis not present

## 2023-07-19 DIAGNOSIS — M6281 Muscle weakness (generalized): Secondary | ICD-10-CM

## 2023-07-19 DIAGNOSIS — M25561 Pain in right knee: Secondary | ICD-10-CM | POA: Diagnosis not present
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
## 2023-07-20 ENCOUNTER — Encounter (HOSPITAL_COMMUNITY): Payer: Self-pay

## 2023-07-20 ENCOUNTER — Encounter (HOSPITAL_BASED_OUTPATIENT_CLINIC_OR_DEPARTMENT_OTHER): Payer: Self-pay | Admitting: Obstetrics & Gynecology

## 2023-07-20 ENCOUNTER — Encounter: Payer: Self-pay | Admitting: Family Medicine

## 2023-07-20 ENCOUNTER — Ambulatory Visit (INDEPENDENT_AMBULATORY_CARE_PROVIDER_SITE_OTHER): Payer: BC Managed Care – PPO | Admitting: Family Medicine

## 2023-07-20 ENCOUNTER — Encounter: Payer: Self-pay | Admitting: Physical Therapy

## 2023-07-20 ENCOUNTER — Ambulatory Visit: Payer: BC Managed Care – PPO | Admitting: Physical Therapy

## 2023-07-20 VITALS — BP 120/80 | HR 71 | Temp 98.9°F | Ht 62.0 in | Wt 199.6 lb

## 2023-07-20 DIAGNOSIS — R102 Pelvic and perineal pain unspecified side: Secondary | ICD-10-CM

## 2023-07-20 DIAGNOSIS — R279 Unspecified lack of coordination: Secondary | ICD-10-CM | POA: Diagnosis not present

## 2023-07-20 DIAGNOSIS — J45909 Unspecified asthma, uncomplicated: Secondary | ICD-10-CM

## 2023-07-20 DIAGNOSIS — M6281 Muscle weakness (generalized): Secondary | ICD-10-CM | POA: Diagnosis not present

## 2023-07-20 DIAGNOSIS — M170 Bilateral primary osteoarthritis of knee: Secondary | ICD-10-CM

## 2023-07-20 DIAGNOSIS — Z6836 Body mass index (BMI) 36.0-36.9, adult: Secondary | ICD-10-CM

## 2023-07-20 DIAGNOSIS — F339 Major depressive disorder, recurrent, unspecified: Secondary | ICD-10-CM

## 2023-07-20 DIAGNOSIS — M62838 Other muscle spasm: Secondary | ICD-10-CM

## 2023-07-20 DIAGNOSIS — Z8669 Personal history of other diseases of the nervous system and sense organs: Secondary | ICD-10-CM

## 2023-07-20 DIAGNOSIS — E66812 Obesity, class 2: Secondary | ICD-10-CM | POA: Diagnosis not present

## 2023-07-20 DIAGNOSIS — R351 Nocturia: Secondary | ICD-10-CM

## 2023-07-20 DIAGNOSIS — R3915 Urgency of urination: Secondary | ICD-10-CM | POA: Diagnosis not present

## 2023-07-20 DIAGNOSIS — R296 Repeated falls: Secondary | ICD-10-CM

## 2023-07-20 DIAGNOSIS — M25562 Pain in left knee: Secondary | ICD-10-CM | POA: Diagnosis not present

## 2023-07-20 DIAGNOSIS — M25561 Pain in right knee: Secondary | ICD-10-CM | POA: Diagnosis not present

## 2023-07-20 DIAGNOSIS — Z7689 Persons encountering health services in other specified circumstances: Secondary | ICD-10-CM

## 2023-07-20 DIAGNOSIS — I499 Cardiac arrhythmia, unspecified: Secondary | ICD-10-CM
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
## 2023-07-21 ENCOUNTER — Ambulatory Visit: Payer: BC Managed Care – PPO

## 2023-07-21 ENCOUNTER — Ambulatory Visit (HOSPITAL_COMMUNITY): Payer: BC Managed Care – PPO

## 2023-07-21 DIAGNOSIS — M25561 Pain in right knee: Secondary | ICD-10-CM | POA: Diagnosis not present

## 2023-07-21 DIAGNOSIS — R351 Nocturia: Secondary | ICD-10-CM | POA: Diagnosis not present

## 2023-07-21 DIAGNOSIS — M62838 Other muscle spasm: Secondary | ICD-10-CM | POA: Diagnosis not present

## 2023-07-21 DIAGNOSIS — R279 Unspecified lack of coordination: Secondary | ICD-10-CM

## 2023-07-21 DIAGNOSIS — M6281 Muscle weakness (generalized): Secondary | ICD-10-CM

## 2023-07-21 DIAGNOSIS — R102 Pelvic and perineal pain: Secondary | ICD-10-CM

## 2023-07-21 DIAGNOSIS — R3915 Urgency of urination: Secondary | ICD-10-CM | POA: Diagnosis not present

## 2023-07-21 DIAGNOSIS — M25562 Pain in left knee: Secondary | ICD-10-CM | POA: Diagnosis not present
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
## 2023-07-22 ENCOUNTER — Ambulatory Visit: Payer: BC Managed Care – PPO | Admitting: Physical Therapy

## 2023-07-22 ENCOUNTER — Ambulatory Visit (HOSPITAL_COMMUNITY)
Admission: RE | Admit: 2023-07-22 | Discharge: 2023-07-22 | Disposition: A | Discharge status: Home / Self Care | Payer: BC Managed Care – PPO | Source: Ambulatory Visit | Attending: Cardiology

## 2023-07-22 ENCOUNTER — Encounter: Payer: Self-pay | Admitting: Physical Therapy

## 2023-07-22 DIAGNOSIS — M25562 Pain in left knee: Secondary | ICD-10-CM | POA: Diagnosis not present

## 2023-07-22 DIAGNOSIS — M62838 Other muscle spasm: Secondary | ICD-10-CM | POA: Diagnosis not present

## 2023-07-22 DIAGNOSIS — R279 Unspecified lack of coordination: Secondary | ICD-10-CM | POA: Diagnosis not present

## 2023-07-22 DIAGNOSIS — R351 Nocturia: Secondary | ICD-10-CM

## 2023-07-22 DIAGNOSIS — M6281 Muscle weakness (generalized): Secondary | ICD-10-CM | POA: Diagnosis not present

## 2023-07-22 DIAGNOSIS — Z7189 Other specified counseling: Secondary | ICD-10-CM | POA: Diagnosis not present

## 2023-07-22 DIAGNOSIS — I872 Venous insufficiency (chronic) (peripheral): Secondary | ICD-10-CM | POA: Diagnosis not present

## 2023-07-22 DIAGNOSIS — R102 Pelvic and perineal pain: Secondary | ICD-10-CM | POA: Diagnosis not present

## 2023-07-22 DIAGNOSIS — R3915 Urgency of urination: Secondary | ICD-10-CM | POA: Diagnosis not present

## 2023-07-22 DIAGNOSIS — M25561 Pain in right knee: Secondary | ICD-10-CM

## 2023-07-22 DIAGNOSIS — R072 Precordial pain: Secondary | ICD-10-CM | POA: Diagnosis not present

## 2023-07-22 DIAGNOSIS — R079 Chest pain, unspecified: Secondary | ICD-10-CM | POA: Insufficient documentation

## 2023-07-22 MED ORDER — NITROGLYCERIN 0.4 MG SL SUBL
SUBLINGUAL_TABLET | SUBLINGUAL | Status: AC
  Filled 2023-07-22: qty 2

## 2023-07-22 MED ORDER — NITROGLYCERIN 0.4 MG SL SUBL
0.8000 mg | SUBLINGUAL_TABLET | Freq: Once | SUBLINGUAL | Status: DC
Start: 2023-07-22 — End: 2023-07-23

## 2023-07-22 MED ORDER — IOHEXOL 350 MG/ML SOLN
100.0000 mL | Freq: Once | INTRAVENOUS | Status: AC | PRN
  Administered 2023-07-22: 100 mL via INTRAVENOUS
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
## 2023-07-25 DIAGNOSIS — Z713 Dietary counseling and surveillance: Secondary | ICD-10-CM | POA: Diagnosis not present

## 2023-07-26 ENCOUNTER — Ambulatory Visit: Payer: BC Managed Care – PPO

## 2023-07-26 DIAGNOSIS — R279 Unspecified lack of coordination: Secondary | ICD-10-CM

## 2023-07-26 DIAGNOSIS — R102 Pelvic and perineal pain: Secondary | ICD-10-CM | POA: Diagnosis not present

## 2023-07-26 DIAGNOSIS — M25562 Pain in left knee: Secondary | ICD-10-CM | POA: Diagnosis not present

## 2023-07-26 DIAGNOSIS — M6281 Muscle weakness (generalized): Secondary | ICD-10-CM

## 2023-07-26 DIAGNOSIS — M62838 Other muscle spasm: Secondary | ICD-10-CM

## 2023-07-26 DIAGNOSIS — R3915 Urgency of urination: Secondary | ICD-10-CM | POA: Diagnosis not present

## 2023-07-26 DIAGNOSIS — R351 Nocturia: Secondary | ICD-10-CM | POA: Diagnosis not present

## 2023-07-26 DIAGNOSIS — M25561 Pain in right knee: Secondary | ICD-10-CM | POA: Diagnosis not present
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
## 2023-07-27 ENCOUNTER — Ambulatory Visit: Payer: BC Managed Care – PPO | Admitting: Physical Therapy

## 2023-07-27 ENCOUNTER — Encounter: Payer: Self-pay | Admitting: Physical Therapy

## 2023-07-27 DIAGNOSIS — R351 Nocturia: Secondary | ICD-10-CM

## 2023-07-27 DIAGNOSIS — R3915 Urgency of urination: Secondary | ICD-10-CM | POA: Diagnosis not present

## 2023-07-27 DIAGNOSIS — M6281 Muscle weakness (generalized): Secondary | ICD-10-CM | POA: Diagnosis not present

## 2023-07-27 DIAGNOSIS — M62838 Other muscle spasm: Secondary | ICD-10-CM

## 2023-07-27 DIAGNOSIS — M25561 Pain in right knee: Secondary | ICD-10-CM | POA: Diagnosis not present

## 2023-07-27 DIAGNOSIS — M25562 Pain in left knee: Secondary | ICD-10-CM

## 2023-07-27 DIAGNOSIS — R102 Pelvic and perineal pain: Secondary | ICD-10-CM | POA: Diagnosis not present

## 2023-07-27 DIAGNOSIS — R279 Unspecified lack of coordination: Secondary | ICD-10-CM
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
## 2023-07-28 ENCOUNTER — Ambulatory Visit: Payer: BC Managed Care – PPO

## 2023-07-28 DIAGNOSIS — R102 Pelvic and perineal pain: Secondary | ICD-10-CM | POA: Diagnosis not present

## 2023-07-28 DIAGNOSIS — M62838 Other muscle spasm: Secondary | ICD-10-CM | POA: Diagnosis not present

## 2023-07-28 DIAGNOSIS — R279 Unspecified lack of coordination: Secondary | ICD-10-CM | POA: Diagnosis not present

## 2023-07-28 DIAGNOSIS — M6281 Muscle weakness (generalized): Secondary | ICD-10-CM

## 2023-07-28 DIAGNOSIS — M25561 Pain in right knee: Secondary | ICD-10-CM

## 2023-07-28 DIAGNOSIS — M25562 Pain in left knee: Secondary | ICD-10-CM | POA: Diagnosis not present

## 2023-07-28 DIAGNOSIS — R3915 Urgency of urination: Secondary | ICD-10-CM | POA: Diagnosis not present

## 2023-07-28 DIAGNOSIS — R351 Nocturia: Secondary | ICD-10-CM | POA: Diagnosis not present
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
## 2023-08-03 ENCOUNTER — Ambulatory Visit: Payer: BC Managed Care – PPO | Admitting: Physical Therapy

## 2023-08-03 ENCOUNTER — Encounter: Payer: Self-pay | Admitting: Neurology

## 2023-08-03 ENCOUNTER — Encounter: Payer: Self-pay | Admitting: Physical Therapy

## 2023-08-03 ENCOUNTER — Telehealth: Payer: Self-pay

## 2023-08-03 DIAGNOSIS — R102 Pelvic and perineal pain: Secondary | ICD-10-CM | POA: Diagnosis not present

## 2023-08-03 DIAGNOSIS — R3915 Urgency of urination: Secondary | ICD-10-CM

## 2023-08-03 DIAGNOSIS — R351 Nocturia: Secondary | ICD-10-CM | POA: Diagnosis not present

## 2023-08-03 DIAGNOSIS — M25561 Pain in right knee: Secondary | ICD-10-CM

## 2023-08-03 DIAGNOSIS — M6281 Muscle weakness (generalized): Secondary | ICD-10-CM

## 2023-08-03 DIAGNOSIS — R279 Unspecified lack of coordination: Secondary | ICD-10-CM | POA: Diagnosis not present

## 2023-08-03 DIAGNOSIS — M62838 Other muscle spasm: Secondary | ICD-10-CM | POA: Diagnosis not present

## 2023-08-03 DIAGNOSIS — M25562 Pain in left knee: Secondary | ICD-10-CM | POA: Diagnosis not present

## 2023-08-03 NOTE — Telephone Encounter (Signed)
PA needed for [place], Per last PA it expired 07/27/23.

## 2023-08-05 ENCOUNTER — Encounter (HOSPITAL_BASED_OUTPATIENT_CLINIC_OR_DEPARTMENT_OTHER): Payer: Self-pay

## 2023-08-07 ENCOUNTER — Encounter (HOSPITAL_BASED_OUTPATIENT_CLINIC_OR_DEPARTMENT_OTHER): Payer: Self-pay | Admitting: Cardiology

## 2023-08-09 ENCOUNTER — Ambulatory Visit: Payer: BC Managed Care – PPO | Attending: Obstetrics & Gynecology

## 2023-08-09 DIAGNOSIS — M25561 Pain in right knee: Secondary | ICD-10-CM | POA: Insufficient documentation

## 2023-08-09 DIAGNOSIS — M25562 Pain in left knee: Secondary | ICD-10-CM | POA: Diagnosis not present

## 2023-08-09 DIAGNOSIS — R351 Nocturia: Secondary | ICD-10-CM | POA: Insufficient documentation

## 2023-08-09 DIAGNOSIS — R279 Unspecified lack of coordination: Secondary | ICD-10-CM | POA: Insufficient documentation

## 2023-08-09 DIAGNOSIS — R3915 Urgency of urination: Secondary | ICD-10-CM | POA: Insufficient documentation

## 2023-08-09 DIAGNOSIS — R102 Pelvic and perineal pain: Secondary | ICD-10-CM | POA: Diagnosis not present

## 2023-08-09 DIAGNOSIS — M6281 Muscle weakness (generalized): Secondary | ICD-10-CM | POA: Insufficient documentation

## 2023-08-09 DIAGNOSIS — M62838 Other muscle spasm: Secondary | ICD-10-CM | POA: Insufficient documentation
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
## 2023-08-10 ENCOUNTER — Encounter: Payer: Self-pay | Admitting: Physical Therapy

## 2023-08-10 ENCOUNTER — Ambulatory Visit: Payer: BC Managed Care – PPO | Admitting: Physical Therapy

## 2023-08-10 DIAGNOSIS — R3915 Urgency of urination: Secondary | ICD-10-CM

## 2023-08-10 DIAGNOSIS — M62838 Other muscle spasm: Secondary | ICD-10-CM

## 2023-08-10 DIAGNOSIS — R351 Nocturia: Secondary | ICD-10-CM

## 2023-08-10 DIAGNOSIS — M25562 Pain in left knee: Secondary | ICD-10-CM | POA: Diagnosis not present

## 2023-08-10 DIAGNOSIS — R102 Pelvic and perineal pain: Secondary | ICD-10-CM | POA: Diagnosis not present

## 2023-08-10 DIAGNOSIS — R279 Unspecified lack of coordination: Secondary | ICD-10-CM

## 2023-08-10 DIAGNOSIS — M25561 Pain in right knee: Secondary | ICD-10-CM | POA: Diagnosis not present

## 2023-08-10 DIAGNOSIS — M6281 Muscle weakness (generalized): Secondary | ICD-10-CM | POA: Diagnosis not present

## 2023-08-11 ENCOUNTER — Ambulatory Visit: Payer: BC Managed Care – PPO

## 2023-08-11 DIAGNOSIS — M25561 Pain in right knee: Secondary | ICD-10-CM

## 2023-08-11 DIAGNOSIS — R279 Unspecified lack of coordination: Secondary | ICD-10-CM | POA: Diagnosis not present

## 2023-08-11 DIAGNOSIS — R102 Pelvic and perineal pain: Secondary | ICD-10-CM

## 2023-08-11 DIAGNOSIS — M6281 Muscle weakness (generalized): Secondary | ICD-10-CM | POA: Diagnosis not present

## 2023-08-11 DIAGNOSIS — M62838 Other muscle spasm: Secondary | ICD-10-CM | POA: Diagnosis not present

## 2023-08-11 DIAGNOSIS — R351 Nocturia: Secondary | ICD-10-CM | POA: Diagnosis not present

## 2023-08-11 DIAGNOSIS — M25562 Pain in left knee: Secondary | ICD-10-CM | POA: Diagnosis not present

## 2023-08-11 DIAGNOSIS — R3915 Urgency of urination: Secondary | ICD-10-CM | POA: Diagnosis not present
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
## 2023-08-12 ENCOUNTER — Encounter: Payer: Self-pay | Admitting: Physical Therapy

## 2023-08-12 ENCOUNTER — Other Ambulatory Visit (HOSPITAL_COMMUNITY): Payer: Self-pay

## 2023-08-12 ENCOUNTER — Ambulatory Visit: Payer: BC Managed Care – PPO | Admitting: Physical Therapy

## 2023-08-12 ENCOUNTER — Telehealth: Payer: Self-pay

## 2023-08-12 DIAGNOSIS — M25561 Pain in right knee: Secondary | ICD-10-CM | POA: Diagnosis not present

## 2023-08-12 DIAGNOSIS — R102 Pelvic and perineal pain unspecified side: Secondary | ICD-10-CM

## 2023-08-12 DIAGNOSIS — M62838 Other muscle spasm: Secondary | ICD-10-CM | POA: Diagnosis not present

## 2023-08-12 DIAGNOSIS — M6281 Muscle weakness (generalized): Secondary | ICD-10-CM | POA: Diagnosis not present

## 2023-08-12 DIAGNOSIS — M25562 Pain in left knee: Secondary | ICD-10-CM | POA: Diagnosis not present

## 2023-08-12 DIAGNOSIS — R351 Nocturia: Secondary | ICD-10-CM

## 2023-08-12 DIAGNOSIS — R279 Unspecified lack of coordination: Secondary | ICD-10-CM

## 2023-08-12 DIAGNOSIS — R3915 Urgency of urination: Secondary | ICD-10-CM
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
## 2023-08-12 NOTE — Telephone Encounter (Signed)
PA request has been Submitted. New Encounter created for follow up. For additional info see Pharmacy Prior Auth telephone encounter from 12/06.

## 2023-08-12 NOTE — Telephone Encounter (Signed)
*  [facility]  Pharmacy Patient Advocate Encounter   Received notification from CoverMyMeds that prior authorization for AJOVY (fremanezumab-vfrm) injection 225MG /1.5ML auto-injectors  is required/requested.   Insurance verification completed.   The patient is insured through [facility] .   Per test claim: PA required; PA started via CoverMyMeds. KEY [id number] . Waiting for clinical questions to populate.

## 2023-08-12 NOTE — Telephone Encounter (Signed)
Patient reach out for the status of the Prior approval of Ajovy.

## 2023-08-15 DIAGNOSIS — Z713 Dietary counseling and surveillance: Secondary | ICD-10-CM | POA: Diagnosis not present

## 2023-08-16 ENCOUNTER — Ambulatory Visit: Payer: BC Managed Care – PPO

## 2023-08-16 DIAGNOSIS — M62838 Other muscle spasm: Secondary | ICD-10-CM

## 2023-08-16 DIAGNOSIS — R279 Unspecified lack of coordination: Secondary | ICD-10-CM | POA: Diagnosis not present

## 2023-08-16 DIAGNOSIS — R102 Pelvic and perineal pain: Secondary | ICD-10-CM

## 2023-08-16 DIAGNOSIS — M25561 Pain in right knee: Secondary | ICD-10-CM | POA: Diagnosis not present

## 2023-08-16 DIAGNOSIS — R3915 Urgency of urination: Secondary | ICD-10-CM | POA: Diagnosis not present

## 2023-08-16 DIAGNOSIS — M6281 Muscle weakness (generalized): Secondary | ICD-10-CM | POA: Diagnosis not present

## 2023-08-16 DIAGNOSIS — R351 Nocturia: Secondary | ICD-10-CM | POA: Diagnosis not present

## 2023-08-16 DIAGNOSIS — M25562 Pain in left knee: Secondary | ICD-10-CM | POA: Diagnosis not present
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
## 2023-08-17 ENCOUNTER — Encounter: Payer: Self-pay | Admitting: Physical Therapy

## 2023-08-17 ENCOUNTER — Ambulatory Visit: Payer: BC Managed Care – PPO | Admitting: Physical Therapy

## 2023-08-17 DIAGNOSIS — M25561 Pain in right knee: Secondary | ICD-10-CM

## 2023-08-17 DIAGNOSIS — R351 Nocturia: Secondary | ICD-10-CM

## 2023-08-17 DIAGNOSIS — M6281 Muscle weakness (generalized): Secondary | ICD-10-CM

## 2023-08-17 DIAGNOSIS — R279 Unspecified lack of coordination: Secondary | ICD-10-CM

## 2023-08-17 DIAGNOSIS — R102 Pelvic and perineal pain: Secondary | ICD-10-CM | POA: Diagnosis not present

## 2023-08-17 DIAGNOSIS — R3915 Urgency of urination: Secondary | ICD-10-CM

## 2023-08-17 DIAGNOSIS — M62838 Other muscle spasm: Secondary | ICD-10-CM | POA: Diagnosis not present

## 2023-08-17 DIAGNOSIS — M25562 Pain in left knee: Secondary | ICD-10-CM | POA: Diagnosis not present

## 2023-08-18 ENCOUNTER — Ambulatory Visit: Payer: BC Managed Care – PPO

## 2023-08-18 DIAGNOSIS — M62838 Other muscle spasm: Secondary | ICD-10-CM | POA: Diagnosis not present

## 2023-08-18 DIAGNOSIS — R102 Pelvic and perineal pain: Secondary | ICD-10-CM

## 2023-08-18 DIAGNOSIS — R351 Nocturia: Secondary | ICD-10-CM | POA: Diagnosis not present

## 2023-08-18 DIAGNOSIS — M25562 Pain in left knee: Secondary | ICD-10-CM | POA: Diagnosis not present

## 2023-08-18 DIAGNOSIS — R279 Unspecified lack of coordination: Secondary | ICD-10-CM

## 2023-08-18 DIAGNOSIS — M25561 Pain in right knee: Secondary | ICD-10-CM | POA: Diagnosis not present

## 2023-08-18 DIAGNOSIS — M6281 Muscle weakness (generalized): Secondary | ICD-10-CM

## 2023-08-18 DIAGNOSIS — R3915 Urgency of urination: Secondary | ICD-10-CM | POA: Diagnosis not present
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
## 2023-08-19 ENCOUNTER — Encounter: Payer: Self-pay | Admitting: Physical Therapy

## 2023-08-19 ENCOUNTER — Ambulatory Visit: Payer: BC Managed Care – PPO | Admitting: Physical Therapy

## 2023-08-19 DIAGNOSIS — R351 Nocturia: Secondary | ICD-10-CM

## 2023-08-19 DIAGNOSIS — R279 Unspecified lack of coordination: Secondary | ICD-10-CM | POA: Diagnosis not present

## 2023-08-19 DIAGNOSIS — M6281 Muscle weakness (generalized): Secondary | ICD-10-CM | POA: Diagnosis not present

## 2023-08-19 DIAGNOSIS — M25562 Pain in left knee: Secondary | ICD-10-CM | POA: Diagnosis not present

## 2023-08-19 DIAGNOSIS — M25561 Pain in right knee: Secondary | ICD-10-CM | POA: Diagnosis not present

## 2023-08-19 DIAGNOSIS — M62838 Other muscle spasm: Secondary | ICD-10-CM | POA: Diagnosis not present

## 2023-08-19 DIAGNOSIS — R102 Pelvic and perineal pain: Secondary | ICD-10-CM | POA: Diagnosis not present

## 2023-08-19 DIAGNOSIS — R3915 Urgency of urination: Secondary | ICD-10-CM | POA: Diagnosis not present

## 2023-08-23 ENCOUNTER — Ambulatory Visit: Payer: BC Managed Care – PPO

## 2023-08-23 DIAGNOSIS — M62838 Other muscle spasm: Secondary | ICD-10-CM | POA: Diagnosis not present

## 2023-08-23 DIAGNOSIS — R279 Unspecified lack of coordination: Secondary | ICD-10-CM

## 2023-08-23 DIAGNOSIS — M25562 Pain in left knee: Secondary | ICD-10-CM | POA: Diagnosis not present

## 2023-08-23 DIAGNOSIS — R3915 Urgency of urination: Secondary | ICD-10-CM | POA: Diagnosis not present

## 2023-08-23 DIAGNOSIS — M25561 Pain in right knee: Secondary | ICD-10-CM | POA: Diagnosis not present

## 2023-08-23 DIAGNOSIS — M6281 Muscle weakness (generalized): Secondary | ICD-10-CM

## 2023-08-23 DIAGNOSIS — R102 Pelvic and perineal pain: Secondary | ICD-10-CM | POA: Diagnosis not present

## 2023-08-23 DIAGNOSIS — R351 Nocturia: Secondary | ICD-10-CM | POA: Diagnosis not present
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
## 2023-08-24 ENCOUNTER — Ambulatory Visit: Payer: BC Managed Care – PPO | Admitting: Physical Therapy

## 2023-08-24 ENCOUNTER — Encounter: Payer: Self-pay | Admitting: Physical Therapy

## 2023-08-24 DIAGNOSIS — M6281 Muscle weakness (generalized): Secondary | ICD-10-CM | POA: Diagnosis not present

## 2023-08-24 DIAGNOSIS — R351 Nocturia: Secondary | ICD-10-CM

## 2023-08-24 DIAGNOSIS — R102 Pelvic and perineal pain: Secondary | ICD-10-CM

## 2023-08-24 DIAGNOSIS — R279 Unspecified lack of coordination: Secondary | ICD-10-CM

## 2023-08-24 DIAGNOSIS — M25561 Pain in right knee: Secondary | ICD-10-CM | POA: Diagnosis not present

## 2023-08-24 DIAGNOSIS — M25562 Pain in left knee: Secondary | ICD-10-CM | POA: Diagnosis not present

## 2023-08-24 DIAGNOSIS — R3915 Urgency of urination: Secondary | ICD-10-CM

## 2023-08-24 DIAGNOSIS — M62838 Other muscle spasm: Secondary | ICD-10-CM | POA: Diagnosis not present
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
## 2023-08-26 ENCOUNTER — Ambulatory Visit: Payer: BC Managed Care – PPO | Admitting: Physical Therapy

## 2023-08-26 ENCOUNTER — Encounter: Payer: Self-pay | Admitting: Physical Therapy

## 2023-08-26 DIAGNOSIS — M62838 Other muscle spasm: Secondary | ICD-10-CM

## 2023-08-26 DIAGNOSIS — R3915 Urgency of urination: Secondary | ICD-10-CM

## 2023-08-26 DIAGNOSIS — M25561 Pain in right knee: Secondary | ICD-10-CM

## 2023-08-26 DIAGNOSIS — M6281 Muscle weakness (generalized): Secondary | ICD-10-CM | POA: Diagnosis not present

## 2023-08-26 DIAGNOSIS — R279 Unspecified lack of coordination: Secondary | ICD-10-CM | POA: Diagnosis not present

## 2023-08-26 DIAGNOSIS — R102 Pelvic and perineal pain: Secondary | ICD-10-CM

## 2023-08-26 DIAGNOSIS — R351 Nocturia: Secondary | ICD-10-CM

## 2023-08-26 DIAGNOSIS — M25562 Pain in left knee: Secondary | ICD-10-CM | POA: Diagnosis not present

## 2023-09-01 ENCOUNTER — Ambulatory Visit: Payer: BC Managed Care – PPO

## 2023-09-01 DIAGNOSIS — R3915 Urgency of urination: Secondary | ICD-10-CM | POA: Diagnosis not present

## 2023-09-01 DIAGNOSIS — M25562 Pain in left knee: Secondary | ICD-10-CM

## 2023-09-01 DIAGNOSIS — R279 Unspecified lack of coordination: Secondary | ICD-10-CM

## 2023-09-01 DIAGNOSIS — M62838 Other muscle spasm: Secondary | ICD-10-CM | POA: Diagnosis not present

## 2023-09-01 DIAGNOSIS — R102 Pelvic and perineal pain: Secondary | ICD-10-CM

## 2023-09-01 DIAGNOSIS — M25561 Pain in right knee: Secondary | ICD-10-CM | POA: Diagnosis not present

## 2023-09-01 DIAGNOSIS — R351 Nocturia: Secondary | ICD-10-CM | POA: Diagnosis not present

## 2023-09-01 DIAGNOSIS — M6281 Muscle weakness (generalized): Secondary | ICD-10-CM | POA: Diagnosis not present
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
## 2023-09-02 ENCOUNTER — Ambulatory Visit: Payer: BC Managed Care – PPO | Admitting: Physical Therapy

## 2023-09-02 ENCOUNTER — Other Ambulatory Visit (HOSPITAL_COMMUNITY): Payer: Self-pay

## 2023-09-02 ENCOUNTER — Encounter: Payer: Self-pay | Admitting: Physical Therapy

## 2023-09-02 DIAGNOSIS — R102 Pelvic and perineal pain: Secondary | ICD-10-CM

## 2023-09-02 DIAGNOSIS — M62838 Other muscle spasm: Secondary | ICD-10-CM

## 2023-09-02 DIAGNOSIS — R279 Unspecified lack of coordination: Secondary | ICD-10-CM

## 2023-09-02 DIAGNOSIS — M6281 Muscle weakness (generalized): Secondary | ICD-10-CM | POA: Diagnosis not present

## 2023-09-02 DIAGNOSIS — M25562 Pain in left knee: Secondary | ICD-10-CM | POA: Diagnosis not present

## 2023-09-02 DIAGNOSIS — M25561 Pain in right knee: Secondary | ICD-10-CM | POA: Diagnosis not present

## 2023-09-02 DIAGNOSIS — R3915 Urgency of urination: Secondary | ICD-10-CM

## 2023-09-02 DIAGNOSIS — R351 Nocturia: Secondary | ICD-10-CM

## 2023-09-02 NOTE — Telephone Encounter (Signed)
Pharmacy Patient Advocate Encounter   Received notification from Pt Calls Messages that prior authorization for Ajovy is required/requested.   Insurance verification completed.   The patient is insured through [facility] .   Per test claim: PA required; PA submitted to above mentioned insurance via Phone Key/confirmation #/EOC [id number] Status is pending  Determination will be received via fax

## 2023-09-05 ENCOUNTER — Ambulatory Visit: Payer: BC Managed Care – PPO

## 2023-09-05 ENCOUNTER — Other Ambulatory Visit (HOSPITAL_BASED_OUTPATIENT_CLINIC_OR_DEPARTMENT_OTHER): Payer: Self-pay | Admitting: Obstetrics & Gynecology

## 2023-09-05 ENCOUNTER — Encounter (HOSPITAL_BASED_OUTPATIENT_CLINIC_OR_DEPARTMENT_OTHER): Payer: Self-pay | Admitting: Obstetrics & Gynecology

## 2023-09-05 ENCOUNTER — Telehealth: Payer: Self-pay | Admitting: Neurology

## 2023-09-05 DIAGNOSIS — R3915 Urgency of urination: Secondary | ICD-10-CM | POA: Diagnosis not present

## 2023-09-05 DIAGNOSIS — Z9889 Other specified postprocedural states: Secondary | ICD-10-CM

## 2023-09-05 DIAGNOSIS — M62838 Other muscle spasm: Secondary | ICD-10-CM | POA: Diagnosis not present

## 2023-09-05 DIAGNOSIS — R102 Pelvic and perineal pain unspecified side: Secondary | ICD-10-CM

## 2023-09-05 DIAGNOSIS — M6289 Other specified disorders of muscle: Secondary | ICD-10-CM

## 2023-09-05 DIAGNOSIS — M25561 Pain in right knee: Secondary | ICD-10-CM | POA: Diagnosis not present

## 2023-09-05 DIAGNOSIS — M6281 Muscle weakness (generalized): Secondary | ICD-10-CM

## 2023-09-05 DIAGNOSIS — R109 Unspecified abdominal pain: Secondary | ICD-10-CM

## 2023-09-05 DIAGNOSIS — M25562 Pain in left knee: Secondary | ICD-10-CM | POA: Diagnosis not present

## 2023-09-05 DIAGNOSIS — R279 Unspecified lack of coordination: Secondary | ICD-10-CM | POA: Diagnosis not present

## 2023-09-05 DIAGNOSIS — R351 Nocturia: Secondary | ICD-10-CM | POA: Diagnosis not present

## 2023-09-05 NOTE — Telephone Encounter (Signed)
Left message with after hours service. She has additional questions about prior authorization. It will auto deny by 09/06/23 at [facility]
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
## 2023-09-06 ENCOUNTER — Other Ambulatory Visit (HOSPITAL_COMMUNITY): Payer: Self-pay

## 2023-09-08 ENCOUNTER — Ambulatory Visit: Payer: BC Managed Care – PPO | Attending: Obstetrics & Gynecology

## 2023-09-08 ENCOUNTER — Telehealth: Payer: Self-pay

## 2023-09-08 DIAGNOSIS — M25562 Pain in left knee: Secondary | ICD-10-CM | POA: Insufficient documentation

## 2023-09-08 DIAGNOSIS — R102 Pelvic and perineal pain: Secondary | ICD-10-CM | POA: Insufficient documentation

## 2023-09-08 DIAGNOSIS — M62838 Other muscle spasm: Secondary | ICD-10-CM | POA: Diagnosis not present

## 2023-09-08 DIAGNOSIS — M25561 Pain in right knee: Secondary | ICD-10-CM | POA: Insufficient documentation

## 2023-09-08 DIAGNOSIS — M6281 Muscle weakness (generalized): Secondary | ICD-10-CM | POA: Insufficient documentation

## 2023-09-08 DIAGNOSIS — R279 Unspecified lack of coordination: Secondary | ICD-10-CM | POA: Diagnosis not present

## 2023-09-08 NOTE — Telephone Encounter (Signed)
 Message from patient insurance, Additional question need to be answered.

## 2023-09-08 NOTE — Telephone Encounter (Signed)
 Per patient mychart message.  I just received a phone call from [facility] a few minutes ago letting me know that my Rx was denied    PA team can someone check on that for me Please.
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
## 2023-09-09 ENCOUNTER — Other Ambulatory Visit (HOSPITAL_COMMUNITY): Payer: Self-pay

## 2023-09-09 NOTE — Telephone Encounter (Signed)
 Called insurance and had case reopened to answer additional questions and request is approved through 09/08/2024. Approval letter is being sent via fax to office fax and PA team fax

## 2023-09-09 NOTE — Telephone Encounter (Signed)
 Case denied and cancelled due to office not answering additional questions when plan called.  I recalled insurance and case was reopened to answer additional questions.   Now approved through 09/08/2024 and letter will be faxed to office and PA team

## 2023-09-12 ENCOUNTER — Ambulatory Visit: Payer: BC Managed Care – PPO

## 2023-09-12 DIAGNOSIS — M25561 Pain in right knee: Secondary | ICD-10-CM

## 2023-09-12 DIAGNOSIS — M25562 Pain in left knee: Secondary | ICD-10-CM | POA: Diagnosis not present

## 2023-09-12 DIAGNOSIS — M6281 Muscle weakness (generalized): Secondary | ICD-10-CM | POA: Diagnosis not present

## 2023-09-12 DIAGNOSIS — M62838 Other muscle spasm: Secondary | ICD-10-CM | POA: Diagnosis not present

## 2023-09-12 DIAGNOSIS — R279 Unspecified lack of coordination: Secondary | ICD-10-CM

## 2023-09-12 DIAGNOSIS — R102 Pelvic and perineal pain: Secondary | ICD-10-CM
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
## 2023-09-15 ENCOUNTER — Ambulatory Visit: Payer: BC Managed Care – PPO

## 2023-09-15 DIAGNOSIS — R102 Pelvic and perineal pain: Secondary | ICD-10-CM | POA: Diagnosis not present

## 2023-09-15 DIAGNOSIS — M6281 Muscle weakness (generalized): Secondary | ICD-10-CM | POA: Diagnosis not present

## 2023-09-15 DIAGNOSIS — M62838 Other muscle spasm: Secondary | ICD-10-CM

## 2023-09-15 DIAGNOSIS — M25561 Pain in right knee: Secondary | ICD-10-CM

## 2023-09-15 DIAGNOSIS — R279 Unspecified lack of coordination: Secondary | ICD-10-CM | POA: Diagnosis not present

## 2023-09-15 DIAGNOSIS — M25562 Pain in left knee: Secondary | ICD-10-CM | POA: Diagnosis not present
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
## 2023-09-19 DIAGNOSIS — Z713 Dietary counseling and surveillance: Secondary | ICD-10-CM | POA: Diagnosis not present

## 2023-09-19 DIAGNOSIS — M9902 Segmental and somatic dysfunction of thoracic region: Secondary | ICD-10-CM | POA: Diagnosis not present

## 2023-09-19 DIAGNOSIS — G43001 Migraine without aura, not intractable, with status migrainosus: Secondary | ICD-10-CM | POA: Diagnosis not present

## 2023-09-19 DIAGNOSIS — R293 Abnormal posture: Secondary | ICD-10-CM | POA: Diagnosis not present

## 2023-09-19 DIAGNOSIS — M9901 Segmental and somatic dysfunction of cervical region: Secondary | ICD-10-CM | POA: Diagnosis not present

## 2023-09-21 DIAGNOSIS — M9901 Segmental and somatic dysfunction of cervical region: Secondary | ICD-10-CM | POA: Diagnosis not present

## 2023-09-21 DIAGNOSIS — M9902 Segmental and somatic dysfunction of thoracic region: Secondary | ICD-10-CM | POA: Diagnosis not present

## 2023-09-21 DIAGNOSIS — R293 Abnormal posture: Secondary | ICD-10-CM | POA: Diagnosis not present

## 2023-09-21 DIAGNOSIS — G43001 Migraine without aura, not intractable, with status migrainosus: Secondary | ICD-10-CM | POA: Diagnosis not present

## 2023-09-22 ENCOUNTER — Other Ambulatory Visit: Payer: Self-pay | Admitting: Chiropractic Medicine

## 2023-09-22 ENCOUNTER — Ambulatory Visit: Payer: BC Managed Care – PPO

## 2023-09-22 DIAGNOSIS — M25562 Pain in left knee: Secondary | ICD-10-CM

## 2023-09-22 DIAGNOSIS — M62838 Other muscle spasm: Secondary | ICD-10-CM

## 2023-09-22 DIAGNOSIS — R102 Pelvic and perineal pain: Secondary | ICD-10-CM | POA: Diagnosis not present

## 2023-09-22 DIAGNOSIS — R279 Unspecified lack of coordination: Secondary | ICD-10-CM | POA: Diagnosis not present

## 2023-09-22 DIAGNOSIS — M6281 Muscle weakness (generalized): Secondary | ICD-10-CM

## 2023-09-22 DIAGNOSIS — R519 Headache, unspecified: Secondary | ICD-10-CM

## 2023-09-22 DIAGNOSIS — M25561 Pain in right knee: Secondary | ICD-10-CM | POA: Diagnosis not present
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
## 2023-09-26 ENCOUNTER — Encounter (HOSPITAL_BASED_OUTPATIENT_CLINIC_OR_DEPARTMENT_OTHER): Payer: Self-pay | Admitting: Cardiology

## 2023-09-26 DIAGNOSIS — M9901 Segmental and somatic dysfunction of cervical region: Secondary | ICD-10-CM | POA: Diagnosis not present

## 2023-09-26 DIAGNOSIS — R293 Abnormal posture: Secondary | ICD-10-CM | POA: Diagnosis not present

## 2023-09-26 DIAGNOSIS — M9902 Segmental and somatic dysfunction of thoracic region: Secondary | ICD-10-CM | POA: Diagnosis not present

## 2023-09-26 DIAGNOSIS — G43001 Migraine without aura, not intractable, with status migrainosus: Secondary | ICD-10-CM | POA: Diagnosis not present

## 2023-09-27 ENCOUNTER — Encounter (HOSPITAL_BASED_OUTPATIENT_CLINIC_OR_DEPARTMENT_OTHER): Payer: Self-pay

## 2023-09-27 ENCOUNTER — Ambulatory Visit: Payer: Self-pay

## 2023-09-27 ENCOUNTER — Ambulatory Visit (HOSPITAL_BASED_OUTPATIENT_CLINIC_OR_DEPARTMENT_OTHER): Payer: BC Managed Care – PPO | Admitting: Cardiology

## 2023-09-27 DIAGNOSIS — M25562 Pain in left knee: Secondary | ICD-10-CM

## 2023-09-27 DIAGNOSIS — M62838 Other muscle spasm: Secondary | ICD-10-CM

## 2023-09-27 DIAGNOSIS — M6281 Muscle weakness (generalized): Secondary | ICD-10-CM

## 2023-09-27 DIAGNOSIS — M25561 Pain in right knee: Secondary | ICD-10-CM | POA: Diagnosis not present

## 2023-09-27 DIAGNOSIS — R102 Pelvic and perineal pain: Secondary | ICD-10-CM

## 2023-09-27 DIAGNOSIS — R279 Unspecified lack of coordination: Secondary | ICD-10-CM
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
## 2023-09-29 ENCOUNTER — Ambulatory Visit: Payer: Self-pay

## 2023-09-29 DIAGNOSIS — M62838 Other muscle spasm: Secondary | ICD-10-CM | POA: Diagnosis not present

## 2023-09-29 DIAGNOSIS — G43001 Migraine without aura, not intractable, with status migrainosus: Secondary | ICD-10-CM | POA: Diagnosis not present

## 2023-09-29 DIAGNOSIS — M25562 Pain in left knee: Secondary | ICD-10-CM | POA: Diagnosis not present

## 2023-09-29 DIAGNOSIS — M9902 Segmental and somatic dysfunction of thoracic region: Secondary | ICD-10-CM | POA: Diagnosis not present

## 2023-09-29 DIAGNOSIS — M25561 Pain in right knee: Secondary | ICD-10-CM

## 2023-09-29 DIAGNOSIS — R102 Pelvic and perineal pain: Secondary | ICD-10-CM

## 2023-09-29 DIAGNOSIS — R293 Abnormal posture: Secondary | ICD-10-CM | POA: Diagnosis not present

## 2023-09-29 DIAGNOSIS — R279 Unspecified lack of coordination: Secondary | ICD-10-CM

## 2023-09-29 DIAGNOSIS — M6281 Muscle weakness (generalized): Secondary | ICD-10-CM | POA: Diagnosis not present

## 2023-09-29 DIAGNOSIS — M9901 Segmental and somatic dysfunction of cervical region: Secondary | ICD-10-CM | POA: Diagnosis not present
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
## 2023-09-30 DIAGNOSIS — M9902 Segmental and somatic dysfunction of thoracic region: Secondary | ICD-10-CM | POA: Diagnosis not present

## 2023-09-30 DIAGNOSIS — M9901 Segmental and somatic dysfunction of cervical region: Secondary | ICD-10-CM | POA: Diagnosis not present

## 2023-09-30 DIAGNOSIS — G43001 Migraine without aura, not intractable, with status migrainosus: Secondary | ICD-10-CM | POA: Diagnosis not present

## 2023-09-30 DIAGNOSIS — R293 Abnormal posture: Secondary | ICD-10-CM | POA: Diagnosis not present

## 2023-10-03 ENCOUNTER — Ambulatory Visit: Payer: BC Managed Care – PPO

## 2023-10-03 DIAGNOSIS — M9901 Segmental and somatic dysfunction of cervical region: Secondary | ICD-10-CM | POA: Diagnosis not present

## 2023-10-03 DIAGNOSIS — R293 Abnormal posture: Secondary | ICD-10-CM | POA: Diagnosis not present

## 2023-10-03 DIAGNOSIS — R279 Unspecified lack of coordination: Secondary | ICD-10-CM | POA: Diagnosis not present

## 2023-10-03 DIAGNOSIS — M62838 Other muscle spasm: Secondary | ICD-10-CM | POA: Diagnosis not present

## 2023-10-03 DIAGNOSIS — R102 Pelvic and perineal pain: Secondary | ICD-10-CM | POA: Diagnosis not present

## 2023-10-03 DIAGNOSIS — M9902 Segmental and somatic dysfunction of thoracic region: Secondary | ICD-10-CM | POA: Diagnosis not present

## 2023-10-03 DIAGNOSIS — M25561 Pain in right knee: Secondary | ICD-10-CM | POA: Diagnosis not present

## 2023-10-03 DIAGNOSIS — Z713 Dietary counseling and surveillance: Secondary | ICD-10-CM | POA: Diagnosis not present

## 2023-10-03 DIAGNOSIS — M25562 Pain in left knee: Secondary | ICD-10-CM

## 2023-10-03 DIAGNOSIS — M6281 Muscle weakness (generalized): Secondary | ICD-10-CM | POA: Diagnosis not present

## 2023-10-03 DIAGNOSIS — G43001 Migraine without aura, not intractable, with status migrainosus: Secondary | ICD-10-CM | POA: Diagnosis not present
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
## 2023-10-06 ENCOUNTER — Ambulatory Visit: Payer: BC Managed Care – PPO

## 2023-10-06 DIAGNOSIS — M6281 Muscle weakness (generalized): Secondary | ICD-10-CM | POA: Diagnosis not present

## 2023-10-06 DIAGNOSIS — R102 Pelvic and perineal pain: Secondary | ICD-10-CM

## 2023-10-06 DIAGNOSIS — R279 Unspecified lack of coordination: Secondary | ICD-10-CM | POA: Diagnosis not present

## 2023-10-06 DIAGNOSIS — R293 Abnormal posture: Secondary | ICD-10-CM | POA: Diagnosis not present

## 2023-10-06 DIAGNOSIS — M9901 Segmental and somatic dysfunction of cervical region: Secondary | ICD-10-CM | POA: Diagnosis not present

## 2023-10-06 DIAGNOSIS — M25561 Pain in right knee: Secondary | ICD-10-CM

## 2023-10-06 DIAGNOSIS — M62838 Other muscle spasm: Secondary | ICD-10-CM | POA: Diagnosis not present

## 2023-10-06 DIAGNOSIS — M9902 Segmental and somatic dysfunction of thoracic region: Secondary | ICD-10-CM | POA: Diagnosis not present

## 2023-10-06 DIAGNOSIS — G43001 Migraine without aura, not intractable, with status migrainosus: Secondary | ICD-10-CM | POA: Diagnosis not present

## 2023-10-06 DIAGNOSIS — M25562 Pain in left knee: Secondary | ICD-10-CM | POA: Diagnosis not present
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
## 2023-10-07 DIAGNOSIS — M9902 Segmental and somatic dysfunction of thoracic region: Secondary | ICD-10-CM | POA: Diagnosis not present

## 2023-10-07 DIAGNOSIS — M9901 Segmental and somatic dysfunction of cervical region: Secondary | ICD-10-CM | POA: Diagnosis not present

## 2023-10-07 DIAGNOSIS — G43001 Migraine without aura, not intractable, with status migrainosus: Secondary | ICD-10-CM | POA: Diagnosis not present

## 2023-10-07 DIAGNOSIS — R293 Abnormal posture: Secondary | ICD-10-CM | POA: Diagnosis not present

## 2023-10-10 ENCOUNTER — Ambulatory Visit: Payer: BC Managed Care – PPO | Attending: Obstetrics & Gynecology

## 2023-10-10 DIAGNOSIS — R279 Unspecified lack of coordination: Secondary | ICD-10-CM | POA: Insufficient documentation

## 2023-10-10 DIAGNOSIS — M25562 Pain in left knee: Secondary | ICD-10-CM | POA: Insufficient documentation

## 2023-10-10 DIAGNOSIS — M62838 Other muscle spasm: Secondary | ICD-10-CM | POA: Diagnosis not present

## 2023-10-10 DIAGNOSIS — M6281 Muscle weakness (generalized): Secondary | ICD-10-CM | POA: Insufficient documentation

## 2023-10-10 DIAGNOSIS — R102 Pelvic and perineal pain: Secondary | ICD-10-CM | POA: Insufficient documentation

## 2023-10-10 DIAGNOSIS — M25561 Pain in right knee: Secondary | ICD-10-CM | POA: Diagnosis not present

## 2023-10-10 DIAGNOSIS — G43001 Migraine without aura, not intractable, with status migrainosus: Secondary | ICD-10-CM | POA: Diagnosis not present

## 2023-10-10 DIAGNOSIS — M9901 Segmental and somatic dysfunction of cervical region: Secondary | ICD-10-CM | POA: Diagnosis not present

## 2023-10-10 DIAGNOSIS — R293 Abnormal posture: Secondary | ICD-10-CM | POA: Diagnosis not present

## 2023-10-10 DIAGNOSIS — M9902 Segmental and somatic dysfunction of thoracic region: Secondary | ICD-10-CM | POA: Diagnosis not present
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
## 2023-10-12 DIAGNOSIS — M9902 Segmental and somatic dysfunction of thoracic region: Secondary | ICD-10-CM | POA: Diagnosis not present

## 2023-10-12 DIAGNOSIS — M9901 Segmental and somatic dysfunction of cervical region: Secondary | ICD-10-CM | POA: Diagnosis not present

## 2023-10-12 DIAGNOSIS — R293 Abnormal posture: Secondary | ICD-10-CM | POA: Diagnosis not present

## 2023-10-12 DIAGNOSIS — G43001 Migraine without aura, not intractable, with status migrainosus: Secondary | ICD-10-CM | POA: Diagnosis not present
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
## 2023-10-14 ENCOUNTER — Encounter: Payer: Self-pay | Admitting: Neurology

## 2023-10-14 ENCOUNTER — Ambulatory Visit (INDEPENDENT_AMBULATORY_CARE_PROVIDER_SITE_OTHER): Payer: BC Managed Care – PPO | Admitting: Neurology

## 2023-10-14 VITALS — BP 145/73 | HR 82 | Ht 62.0 in | Wt 212.0 lb

## 2023-10-14 DIAGNOSIS — G43409 Hemiplegic migraine, not intractable, without status migrainosus: Secondary | ICD-10-CM | POA: Diagnosis not present

## 2023-10-14 DIAGNOSIS — M9902 Segmental and somatic dysfunction of thoracic region: Secondary | ICD-10-CM | POA: Diagnosis not present

## 2023-10-14 DIAGNOSIS — M9901 Segmental and somatic dysfunction of cervical region: Secondary | ICD-10-CM | POA: Diagnosis not present

## 2023-10-14 DIAGNOSIS — G43109 Migraine with aura, not intractable, without status migrainosus: Secondary | ICD-10-CM

## 2023-10-14 DIAGNOSIS — G43001 Migraine without aura, not intractable, with status migrainosus: Secondary | ICD-10-CM | POA: Diagnosis not present

## 2023-10-14 DIAGNOSIS — R293 Abnormal posture: Secondary | ICD-10-CM | POA: Diagnosis not present

## 2023-10-14 NOTE — Patient Instructions (Signed)
 RESTART AJOVY .  GIVE ME UPDATE IN 3 MONTHS.  IF NO IMPROVEMENT, WOULD CHANGE MEDICATION  NURTEC ONCE DAILY AS NEEDED

## 2023-10-17 DIAGNOSIS — Z713 Dietary counseling and surveillance: Secondary | ICD-10-CM | POA: Diagnosis not present

## 2023-10-18 ENCOUNTER — Ambulatory Visit: Payer: BC Managed Care – PPO

## 2023-10-18 DIAGNOSIS — M25561 Pain in right knee: Secondary | ICD-10-CM | POA: Diagnosis not present

## 2023-10-18 DIAGNOSIS — R102 Pelvic and perineal pain: Secondary | ICD-10-CM | POA: Diagnosis not present

## 2023-10-18 DIAGNOSIS — R279 Unspecified lack of coordination: Secondary | ICD-10-CM

## 2023-10-18 DIAGNOSIS — M25562 Pain in left knee: Secondary | ICD-10-CM | POA: Diagnosis not present

## 2023-10-18 DIAGNOSIS — M62838 Other muscle spasm: Secondary | ICD-10-CM | POA: Diagnosis not present

## 2023-10-18 DIAGNOSIS — M6281 Muscle weakness (generalized): Secondary | ICD-10-CM
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
## 2023-10-19 DIAGNOSIS — G43001 Migraine without aura, not intractable, with status migrainosus: Secondary | ICD-10-CM | POA: Diagnosis not present

## 2023-10-19 DIAGNOSIS — M9902 Segmental and somatic dysfunction of thoracic region: Secondary | ICD-10-CM | POA: Diagnosis not present

## 2023-10-19 DIAGNOSIS — M9901 Segmental and somatic dysfunction of cervical region: Secondary | ICD-10-CM | POA: Diagnosis not present

## 2023-10-19 DIAGNOSIS — R293 Abnormal posture: Secondary | ICD-10-CM | POA: Diagnosis not present

## 2023-10-21 DIAGNOSIS — M9902 Segmental and somatic dysfunction of thoracic region: Secondary | ICD-10-CM | POA: Diagnosis not present

## 2023-10-21 DIAGNOSIS — R293 Abnormal posture: Secondary | ICD-10-CM | POA: Diagnosis not present

## 2023-10-21 DIAGNOSIS — G43001 Migraine without aura, not intractable, with status migrainosus: Secondary | ICD-10-CM | POA: Diagnosis not present

## 2023-10-21 DIAGNOSIS — M9901 Segmental and somatic dysfunction of cervical region: Secondary | ICD-10-CM | POA: Diagnosis not present

## 2023-10-24 ENCOUNTER — Ambulatory Visit: Payer: BC Managed Care – PPO

## 2023-10-24 DIAGNOSIS — M25561 Pain in right knee: Secondary | ICD-10-CM

## 2023-10-24 DIAGNOSIS — R279 Unspecified lack of coordination: Secondary | ICD-10-CM | POA: Diagnosis not present

## 2023-10-24 DIAGNOSIS — M6281 Muscle weakness (generalized): Secondary | ICD-10-CM

## 2023-10-24 DIAGNOSIS — R102 Pelvic and perineal pain: Secondary | ICD-10-CM | POA: Diagnosis not present

## 2023-10-24 DIAGNOSIS — M62838 Other muscle spasm: Secondary | ICD-10-CM | POA: Diagnosis not present

## 2023-10-24 DIAGNOSIS — M25562 Pain in left knee: Secondary | ICD-10-CM | POA: Diagnosis not present
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
## 2023-10-24 NOTE — Patient Instructions (Signed)
   The first picture shows that there is no effect on the pelvic floor with gravity eliminated. The next three show that with a wedge pillow or a few pillows from home under your pelvis the pelvic floor is inverted and may relax and allows gravity to help return prolapsed areas more inward to help relieve symptoms. Do this 15-20 mins every evening when symptoms tend to be worse. Stop if you have pain or negative symptoms.   [facility] [address] [place], [place] [address] Phone # [phone] Fax [phone]

## 2023-10-25 DIAGNOSIS — M9902 Segmental and somatic dysfunction of thoracic region: Secondary | ICD-10-CM | POA: Diagnosis not present

## 2023-10-25 DIAGNOSIS — R293 Abnormal posture: Secondary | ICD-10-CM | POA: Diagnosis not present

## 2023-10-25 DIAGNOSIS — M9901 Segmental and somatic dysfunction of cervical region: Secondary | ICD-10-CM | POA: Diagnosis not present

## 2023-10-25 DIAGNOSIS — G43001 Migraine without aura, not intractable, with status migrainosus: Secondary | ICD-10-CM | POA: Diagnosis not present

## 2023-10-28 ENCOUNTER — Ambulatory Visit (INDEPENDENT_AMBULATORY_CARE_PROVIDER_SITE_OTHER): Payer: BC Managed Care – PPO | Admitting: Family Medicine

## 2023-10-28 ENCOUNTER — Encounter: Payer: Self-pay | Admitting: Family Medicine

## 2023-10-28 VITALS — BP 135/79 | HR 78 | Temp 98.1°F | Resp 18 | Ht 62.0 in | Wt 205.0 lb

## 2023-10-28 DIAGNOSIS — G43709 Chronic migraine without aura, not intractable, without status migrainosus: Secondary | ICD-10-CM | POA: Diagnosis not present

## 2023-10-28 DIAGNOSIS — Z6836 Body mass index (BMI) 36.0-36.9, adult: Secondary | ICD-10-CM | POA: Diagnosis not present

## 2023-10-28 DIAGNOSIS — R1030 Lower abdominal pain, unspecified: Secondary | ICD-10-CM | POA: Diagnosis not present

## 2023-10-28 DIAGNOSIS — F339 Major depressive disorder, recurrent, unspecified: Secondary | ICD-10-CM

## 2023-10-28 DIAGNOSIS — Z0000 Encounter for general adult medical examination without abnormal findings: Secondary | ICD-10-CM

## 2023-10-28 DIAGNOSIS — Z131 Encounter for screening for diabetes mellitus: Secondary | ICD-10-CM | POA: Diagnosis not present

## 2023-10-28 DIAGNOSIS — Z0001 Encounter for general adult medical examination with abnormal findings: Secondary | ICD-10-CM | POA: Diagnosis not present

## 2023-10-28 DIAGNOSIS — E66812 Morbid (severe) obesity due to excess calories: Secondary | ICD-10-CM

## 2023-10-28 DIAGNOSIS — G43001 Migraine without aura, not intractable, with status migrainosus: Secondary | ICD-10-CM | POA: Diagnosis not present

## 2023-10-28 DIAGNOSIS — Z862 Personal history of diseases of the blood and blood-forming organs and certain disorders involving the immune mechanism: Secondary | ICD-10-CM

## 2023-10-28 DIAGNOSIS — M9902 Segmental and somatic dysfunction of thoracic region: Secondary | ICD-10-CM | POA: Diagnosis not present

## 2023-10-28 DIAGNOSIS — S63501A Unspecified sprain of right wrist, initial encounter: Secondary | ICD-10-CM

## 2023-10-28 DIAGNOSIS — M9901 Segmental and somatic dysfunction of cervical region: Secondary | ICD-10-CM | POA: Diagnosis not present

## 2023-10-28 DIAGNOSIS — R293 Abnormal posture: Secondary | ICD-10-CM | POA: Diagnosis not present

## 2023-10-28 DIAGNOSIS — J302 Other seasonal allergic rhinitis: Secondary | ICD-10-CM

## 2023-10-28 LAB — COMPREHENSIVE METABOLIC PANEL
ALT: 15 U/L (ref 0–35)
AST: 22 U/L (ref 0–37)
Albumin: 4.1 g/dL (ref 3.5–5.2)
Alkaline Phosphatase: 58 U/L (ref 39–117)
BUN: 15 mg/dL (ref 6–23)
CO2: 27 meq/L (ref 19–32)
Calcium: 8.9 mg/dL (ref 8.4–10.5)
Chloride: 107 meq/L (ref 96–112)
Creatinine, Ser: 0.83 mg/dL (ref 0.40–1.20)
GFR: 84.07 mL/min (ref >60.00)
Glucose, Bld: 89 mg/dL (ref 70–99)
Potassium: 4.1 meq/L (ref 3.5–5.1)
Sodium: 141 meq/L (ref 135–145)
Total Bilirubin: 0.4 mg/dL (ref 0.2–1.2)
Total Protein: 7.1 g/dL (ref 6.0–8.3)

## 2023-10-28 LAB — CBC WITH DIFFERENTIAL/PLATELET
Basophils Absolute: 0 10*3/uL (ref 0.0–0.1)
Basophils Relative: 0.9 % (ref 0.0–3.0)
Eosinophils Absolute: 0.1 10*3/uL (ref 0.0–0.7)
Eosinophils Relative: 1.8 % (ref 0.0–5.0)
HCT: 41.8 % (ref 36.0–46.0)
Hemoglobin: 13.8 g/dL (ref 12.0–15.0)
Lymphocytes Relative: 46.6 % — ABNORMAL HIGH (ref 12.0–46.0)
Lymphs Abs: 1.8 10*3/uL (ref 0.7–4.0)
MCHC: 32.9 g/dL (ref 30.0–36.0)
MCV: 92.8 fL (ref 78.0–100.0)
Monocytes Absolute: 0.3 10*3/uL (ref 0.1–1.0)
Monocytes Relative: 7.2 % (ref 3.0–12.0)
Neutro Abs: 1.7 10*3/uL (ref 1.4–7.7)
Neutrophils Relative %: 43.5 % (ref 43.0–77.0)
Platelets: 261 10*3/uL (ref 150.0–400.0)
RBC: 4.51 Mil/uL (ref 3.87–5.11)
RDW: 13 % (ref 11.5–15.5)
WBC: 3.9 10*3/uL — ABNORMAL LOW (ref 4.0–10.5)

## 2023-10-28 LAB — POCT URINALYSIS DIPSTICK
Bilirubin, UA: NEGATIVE
Blood, UA: NEGATIVE
Glucose, UA: NEGATIVE
Ketones, UA: NEGATIVE
Leukocytes, UA: NEGATIVE
Nitrite, UA: NEGATIVE
Protein, UA: NEGATIVE
Spec Grav, UA: >=1.03 — AB (ref 1.010–1.025)
Urobilinogen, UA: 0.2 U/dL
pH, UA: 5 (ref 5.0–8.0)

## 2023-10-28 LAB — T4, FREE: Free T4: 0.83 ng/dL (ref 0.60–1.60)

## 2023-10-28 LAB — LIPID PANEL
Cholesterol: 263 mg/dL — ABNORMAL HIGH (ref 0–200)
HDL: 110.3 mg/dL (ref >39.00)
LDL Cholesterol: 141 mg/dL — ABNORMAL HIGH (ref 0–99)
NonHDL: 152.54
Total CHOL/HDL Ratio: 2
Triglycerides: 58 mg/dL (ref 0.0–149.0)
VLDL: 11.6 mg/dL (ref 0.0–40.0)

## 2023-10-28 LAB — HEMOGLOBIN A1C: Hgb A1c MFr Bld: 5.5 % (ref 4.6–6.5)

## 2023-10-28 LAB — VITAMIN B12: Vitamin B-12: 491 pg/mL (ref 211–911)

## 2023-10-28 LAB — TSH: TSH: 1.76 u[IU]/mL (ref 0.35–5.50)

## 2023-10-28 LAB — MAGNESIUM: Magnesium: 1.9 mg/dL (ref 1.5–2.5)
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
## 2023-10-29 LAB — IRON,TIBC AND FERRITIN PANEL
%SAT: 25 % (ref 16–45)
Ferritin: 115 ng/mL (ref 16–232)
Iron: 79 ug/dL (ref 40–190)
TIBC: 319 ug/dL (ref 250–450)

## 2023-10-30 ENCOUNTER — Ambulatory Visit
Admission: RE | Admit: 2023-10-30 | Discharge: 2023-10-30 | Disposition: A | Discharge status: Home / Self Care | Payer: BC Managed Care – PPO | Source: Ambulatory Visit | Attending: Chiropractic Medicine | Admitting: Chiropractic Medicine

## 2023-10-30 DIAGNOSIS — R519 Headache, unspecified: Secondary | ICD-10-CM | POA: Diagnosis not present

## 2023-10-30 MED ORDER — GADOPICLENOL 0.5 MMOL/ML IV SOLN
10.0000 mL | Freq: Once | INTRAVENOUS | Status: AC | PRN
  Administered 2023-10-30: 10 mL via INTRAVENOUS

## 2023-10-31 ENCOUNTER — Encounter: Payer: Self-pay | Admitting: Family Medicine

## 2023-10-31 DIAGNOSIS — R293 Abnormal posture: Secondary | ICD-10-CM | POA: Diagnosis not present

## 2023-10-31 DIAGNOSIS — M9901 Segmental and somatic dysfunction of cervical region: Secondary | ICD-10-CM | POA: Diagnosis not present

## 2023-10-31 DIAGNOSIS — Z713 Dietary counseling and surveillance: Secondary | ICD-10-CM | POA: Diagnosis not present

## 2023-10-31 DIAGNOSIS — M9902 Segmental and somatic dysfunction of thoracic region: Secondary | ICD-10-CM | POA: Diagnosis not present

## 2023-10-31 DIAGNOSIS — G43001 Migraine without aura, not intractable, with status migrainosus: Secondary | ICD-10-CM | POA: Diagnosis not present

## 2023-11-01 ENCOUNTER — Encounter (INDEPENDENT_AMBULATORY_CARE_PROVIDER_SITE_OTHER): Payer: Self-pay | Admitting: Otolaryngology

## 2023-11-03 ENCOUNTER — Ambulatory Visit: Payer: BC Managed Care – PPO

## 2023-11-03 DIAGNOSIS — M6281 Muscle weakness (generalized): Secondary | ICD-10-CM | POA: Diagnosis not present

## 2023-11-03 DIAGNOSIS — M25561 Pain in right knee: Secondary | ICD-10-CM | POA: Diagnosis not present

## 2023-11-03 DIAGNOSIS — M25562 Pain in left knee: Secondary | ICD-10-CM | POA: Diagnosis not present

## 2023-11-03 DIAGNOSIS — M62838 Other muscle spasm: Secondary | ICD-10-CM

## 2023-11-03 DIAGNOSIS — R102 Pelvic and perineal pain: Secondary | ICD-10-CM

## 2023-11-03 DIAGNOSIS — R279 Unspecified lack of coordination: Secondary | ICD-10-CM | POA: Diagnosis not present
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
## 2023-11-04 DIAGNOSIS — M9902 Segmental and somatic dysfunction of thoracic region: Secondary | ICD-10-CM | POA: Diagnosis not present

## 2023-11-04 DIAGNOSIS — R293 Abnormal posture: Secondary | ICD-10-CM | POA: Diagnosis not present

## 2023-11-04 DIAGNOSIS — M9901 Segmental and somatic dysfunction of cervical region: Secondary | ICD-10-CM | POA: Diagnosis not present

## 2023-11-04 DIAGNOSIS — G43001 Migraine without aura, not intractable, with status migrainosus: Secondary | ICD-10-CM | POA: Diagnosis not present

## 2023-11-08 ENCOUNTER — Encounter: Payer: Self-pay | Admitting: Family Medicine

## 2023-11-08 DIAGNOSIS — Z789 Other specified health status: Secondary | ICD-10-CM | POA: Diagnosis not present

## 2023-11-08 DIAGNOSIS — L720 Epidermal cyst: Secondary | ICD-10-CM | POA: Diagnosis not present

## 2023-11-09 DIAGNOSIS — M9902 Segmental and somatic dysfunction of thoracic region: Secondary | ICD-10-CM | POA: Diagnosis not present

## 2023-11-09 DIAGNOSIS — R293 Abnormal posture: Secondary | ICD-10-CM | POA: Diagnosis not present

## 2023-11-09 DIAGNOSIS — M9901 Segmental and somatic dysfunction of cervical region: Secondary | ICD-10-CM | POA: Diagnosis not present

## 2023-11-09 DIAGNOSIS — G43001 Migraine without aura, not intractable, with status migrainosus: Secondary | ICD-10-CM | POA: Diagnosis not present

## 2023-11-10 ENCOUNTER — Other Ambulatory Visit: Payer: Self-pay | Admitting: Family Medicine

## 2023-11-10 DIAGNOSIS — J302 Other seasonal allergic rhinitis: Secondary | ICD-10-CM

## 2023-11-10 MED ORDER — FLUTICASONE PROPIONATE 50 MCG/ACT NA SUSP: 1.0000 | Freq: Every day | NASAL | 0 refills | Status: DC

## 2023-11-11 DIAGNOSIS — M9902 Segmental and somatic dysfunction of thoracic region: Secondary | ICD-10-CM | POA: Diagnosis not present

## 2023-11-11 DIAGNOSIS — G43001 Migraine without aura, not intractable, with status migrainosus: Secondary | ICD-10-CM | POA: Diagnosis not present

## 2023-11-11 DIAGNOSIS — R293 Abnormal posture: Secondary | ICD-10-CM | POA: Diagnosis not present

## 2023-11-11 DIAGNOSIS — M9901 Segmental and somatic dysfunction of cervical region: Secondary | ICD-10-CM | POA: Diagnosis not present

## 2023-11-12 ENCOUNTER — Other Ambulatory Visit: Payer: Self-pay | Admitting: Neurology

## 2023-11-14 DIAGNOSIS — Z713 Dietary counseling and surveillance: Secondary | ICD-10-CM | POA: Diagnosis not present

## 2023-11-16 DIAGNOSIS — M9901 Segmental and somatic dysfunction of cervical region: Secondary | ICD-10-CM | POA: Diagnosis not present

## 2023-11-16 DIAGNOSIS — G43001 Migraine without aura, not intractable, with status migrainosus: Secondary | ICD-10-CM | POA: Diagnosis not present

## 2023-11-16 DIAGNOSIS — M9902 Segmental and somatic dysfunction of thoracic region: Secondary | ICD-10-CM | POA: Diagnosis not present

## 2023-11-16 DIAGNOSIS — R293 Abnormal posture: Secondary | ICD-10-CM | POA: Diagnosis not present

## 2023-11-18 DIAGNOSIS — G43001 Migraine without aura, not intractable, with status migrainosus: Secondary | ICD-10-CM | POA: Diagnosis not present

## 2023-11-18 DIAGNOSIS — M9901 Segmental and somatic dysfunction of cervical region: Secondary | ICD-10-CM | POA: Diagnosis not present

## 2023-11-18 DIAGNOSIS — R293 Abnormal posture: Secondary | ICD-10-CM | POA: Diagnosis not present

## 2023-11-18 DIAGNOSIS — M9902 Segmental and somatic dysfunction of thoracic region: Secondary | ICD-10-CM | POA: Diagnosis not present

## 2023-11-24 ENCOUNTER — Other Ambulatory Visit (HOSPITAL_COMMUNITY): Payer: Self-pay

## 2023-11-25 DIAGNOSIS — G43001 Migraine without aura, not intractable, with status migrainosus: Secondary | ICD-10-CM | POA: Diagnosis not present

## 2023-11-25 DIAGNOSIS — M9901 Segmental and somatic dysfunction of cervical region: Secondary | ICD-10-CM | POA: Diagnosis not present

## 2023-11-25 DIAGNOSIS — R293 Abnormal posture: Secondary | ICD-10-CM | POA: Diagnosis not present

## 2023-11-25 DIAGNOSIS — M9902 Segmental and somatic dysfunction of thoracic region: Secondary | ICD-10-CM | POA: Diagnosis not present

## 2023-11-28 DIAGNOSIS — Z713 Dietary counseling and surveillance: Secondary | ICD-10-CM | POA: Diagnosis not present

## 2023-12-02 DIAGNOSIS — M9901 Segmental and somatic dysfunction of cervical region: Secondary | ICD-10-CM | POA: Diagnosis not present

## 2023-12-02 DIAGNOSIS — M9902 Segmental and somatic dysfunction of thoracic region: Secondary | ICD-10-CM | POA: Diagnosis not present

## 2023-12-02 DIAGNOSIS — R293 Abnormal posture: Secondary | ICD-10-CM | POA: Diagnosis not present

## 2023-12-02 DIAGNOSIS — G43001 Migraine without aura, not intractable, with status migrainosus: Secondary | ICD-10-CM | POA: Diagnosis not present

## 2023-12-06 ENCOUNTER — Telehealth (HOSPITAL_BASED_OUTPATIENT_CLINIC_OR_DEPARTMENT_OTHER): Payer: Self-pay | Admitting: Cardiology

## 2023-12-06 NOTE — Telephone Encounter (Signed)
 New Message:       Patient would like to switch from Dr Cristal Deer to Dr Duke Salvia please. Is this alright with you?

## 2023-12-08 DIAGNOSIS — G43001 Migraine without aura, not intractable, with status migrainosus: Secondary | ICD-10-CM | POA: Diagnosis not present

## 2023-12-08 DIAGNOSIS — R293 Abnormal posture: Secondary | ICD-10-CM | POA: Diagnosis not present

## 2023-12-08 DIAGNOSIS — M9902 Segmental and somatic dysfunction of thoracic region: Secondary | ICD-10-CM | POA: Diagnosis not present

## 2023-12-08 DIAGNOSIS — M9901 Segmental and somatic dysfunction of cervical region: Secondary | ICD-10-CM | POA: Diagnosis not present

## 2023-12-09 ENCOUNTER — Ambulatory Visit (INDEPENDENT_AMBULATORY_CARE_PROVIDER_SITE_OTHER): Payer: BC Managed Care – PPO | Admitting: Family Medicine

## 2023-12-09 ENCOUNTER — Encounter: Payer: Self-pay | Admitting: Family Medicine

## 2023-12-09 VITALS — BP 134/80 | HR 67 | Temp 98.6°F | Ht 62.48 in | Wt 202.4 lb

## 2023-12-09 DIAGNOSIS — E782 Mixed hyperlipidemia: Secondary | ICD-10-CM | POA: Diagnosis not present

## 2023-12-09 DIAGNOSIS — G43709 Chronic migraine without aura, not intractable, without status migrainosus: Secondary | ICD-10-CM | POA: Diagnosis not present

## 2023-12-09 DIAGNOSIS — M779 Enthesopathy, unspecified: Secondary | ICD-10-CM | POA: Diagnosis not present

## 2023-12-09 DIAGNOSIS — J302 Other seasonal allergic rhinitis: Secondary | ICD-10-CM | POA: Diagnosis not present

## 2023-12-09 NOTE — Patient Instructions (Signed)
 You can try using over-the-counter Allegra the generic form is called fexofenadine, Xyzal AKA levocetirizine, or Claritin also known a loratadine for your allergies.
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
## 2023-12-23 ENCOUNTER — Ambulatory Visit (HOSPITAL_BASED_OUTPATIENT_CLINIC_OR_DEPARTMENT_OTHER): Payer: BC Managed Care – PPO | Admitting: Cardiology

## 2023-12-23 ENCOUNTER — Encounter (HOSPITAL_BASED_OUTPATIENT_CLINIC_OR_DEPARTMENT_OTHER): Payer: Self-pay

## 2023-12-30 ENCOUNTER — Other Ambulatory Visit (INDEPENDENT_AMBULATORY_CARE_PROVIDER_SITE_OTHER): Payer: Self-pay | Admitting: Otolaryngology

## 2023-12-30 ENCOUNTER — Ambulatory Visit (INDEPENDENT_AMBULATORY_CARE_PROVIDER_SITE_OTHER): Payer: BC Managed Care – PPO | Admitting: Otolaryngology

## 2023-12-30 VITALS — BP 133/76 | HR 69 | Ht 62.0 in | Wt 210.4 lb

## 2023-12-30 DIAGNOSIS — R0981 Nasal congestion: Secondary | ICD-10-CM | POA: Diagnosis not present

## 2023-12-30 DIAGNOSIS — G43009 Migraine without aura, not intractable, without status migrainosus: Secondary | ICD-10-CM | POA: Diagnosis not present

## 2023-12-30 DIAGNOSIS — J342 Deviated nasal septum: Secondary | ICD-10-CM | POA: Diagnosis not present

## 2023-12-30 DIAGNOSIS — K219 Gastro-esophageal reflux disease without esophagitis: Secondary | ICD-10-CM | POA: Diagnosis not present

## 2023-12-30 DIAGNOSIS — R490 Dysphonia: Secondary | ICD-10-CM | POA: Diagnosis not present

## 2023-12-30 DIAGNOSIS — R519 Headache, unspecified: Secondary | ICD-10-CM

## 2023-12-30 DIAGNOSIS — J343 Hypertrophy of nasal turbinates: Secondary | ICD-10-CM | POA: Diagnosis not present

## 2023-12-30 DIAGNOSIS — J3089 Other allergic rhinitis: Secondary | ICD-10-CM

## 2023-12-30 DIAGNOSIS — R0982 Postnasal drip: Secondary | ICD-10-CM

## 2023-12-30 MED ORDER — MOMETASONE FUROATE 50 MCG/ACT NA SUSP: 2.0000 | Freq: Every day | NASAL | 12 refills | Status: DC

## 2023-12-30 MED ORDER — LEVOCETIRIZINE DIHYDROCHLORIDE 5 MG PO TABS: 5.0000 mg | ORAL_TABLET | Freq: Every evening | ORAL | 3 refills | Status: DC

## 2023-12-30 MED ORDER — AZELASTINE HCL 0.1 % NA SOLN: 2.0000 | Freq: Two times a day (BID) | NASAL | 12 refills | Status: DC

## 2023-12-30 NOTE — Patient Instructions (Signed)
 See information about Eustachian Tube Dysfunction below:    Overview The eustachian (say "you-STAY-shee-un") tubes connect the middle ear on each side to the back of the throat. They keep air pressure stable in the ears. If your eustachian tubes become blocked, the air pressure in your ears changes. A quick change in air pressure can cause eustachian tubes to close up. This might happen when an airplane changes altitude or when a scuba diver goes up or down underwater. And a cold can make the tubes swell and block the fluid in the middle ear from draining out. That can cause pain.  Eustachian tube problems often clear up on their own or after treating the cause of the blockage. If your tubes continue to be blocked, you may need surgery.  Follow-up care is a key part of your treatment and safety. Be sure to make and go to all appointments, and call your doctor or nurse advice line (811 in most provinces and territories) if you are having problems. It's also a good idea to know your test results and keep a list of the medicines you take.  How can you care for yourself at home? Try a simple exercise to help open blocked tubes. Close your mouth, hold your nose, and gently blow as if you are blowing your nose. Yawning and chewing gum also may help. You may hear or feel a "pop" when the tubes open. To ease ear pain, apply a warm face cloth or a heating pad set on low. There may be some drainage from the ear when the heat melts earwax. Put a cloth between the heat source and your skin. If your doctor prescribed antibiotics, take them as directed. Do not stop taking them just because you feel better. You need to take the full course of antibiotics. Be safe with medicines. Depending on the cause of the problem, your doctor may recommend over-the-counter medicine. For example, adults may try decongestants for cold symptoms or nasal spray steroids for allergies. Follow the instructions  carefully.  GamingLesson[employer]nl - check out this website to learn more about reflux   -Avoid lying down for at least two hours after a meal or after drinking acidic beverages, like soda, or other caffeinated beverages. This can help to prevent stomach contents from flowing back into the esophagus. -Keep your head elevated while you sleep. Using an extra pillow or two can also help to prevent reflux. -Eat smaller and more frequent meals each day instead of a few large meals. This promotes digestion and can aid in preventing heartburn. -Wear loose-fitting clothes to ease pressure on the stomach, which can worsen heartburn and reflux. -Reduce excess weight around the midsection. This can ease pressure on the stomach. Such pressure can force some stomach contents back up the esophagus  - Take Reflux Gourmet (natural supplement available on Amazon) to help with symptoms of chronic throat irritation
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
## 2024-01-05 DIAGNOSIS — Z713 Dietary counseling and surveillance: Secondary | ICD-10-CM | POA: Diagnosis not present

## 2024-01-06 DIAGNOSIS — G43001 Migraine without aura, not intractable, with status migrainosus: Secondary | ICD-10-CM | POA: Diagnosis not present

## 2024-01-06 DIAGNOSIS — M9902 Segmental and somatic dysfunction of thoracic region: Secondary | ICD-10-CM | POA: Diagnosis not present

## 2024-01-06 DIAGNOSIS — R293 Abnormal posture: Secondary | ICD-10-CM | POA: Diagnosis not present

## 2024-01-06 DIAGNOSIS — M9901 Segmental and somatic dysfunction of cervical region: Secondary | ICD-10-CM | POA: Diagnosis not present

## 2024-01-12 DIAGNOSIS — Z713 Dietary counseling and surveillance: Secondary | ICD-10-CM | POA: Diagnosis not present

## 2024-01-19 DIAGNOSIS — Z713 Dietary counseling and surveillance: Secondary | ICD-10-CM | POA: Diagnosis not present

## 2024-01-21 ENCOUNTER — Encounter: Payer: Self-pay | Admitting: Hematology and Oncology

## 2024-01-25 ENCOUNTER — Ambulatory Visit (INDEPENDENT_AMBULATORY_CARE_PROVIDER_SITE_OTHER): Payer: Self-pay | Admitting: Allergy

## 2024-01-25 ENCOUNTER — Encounter: Payer: Self-pay | Admitting: Allergy

## 2024-01-25 ENCOUNTER — Other Ambulatory Visit: Payer: Self-pay

## 2024-01-25 VITALS — BP 120/76 | HR 65 | Temp 98.1°F | Resp 19 | Ht 64.0 in | Wt 219.0 lb

## 2024-01-25 DIAGNOSIS — H1013 Acute atopic conjunctivitis, bilateral: Secondary | ICD-10-CM

## 2024-01-25 DIAGNOSIS — J310 Chronic rhinitis: Secondary | ICD-10-CM | POA: Diagnosis not present

## 2024-01-25 DIAGNOSIS — H109 Unspecified conjunctivitis: Secondary | ICD-10-CM

## 2024-01-25 DIAGNOSIS — L231 Allergic contact dermatitis due to adhesives: Secondary | ICD-10-CM

## 2024-01-25 DIAGNOSIS — J4520 Mild intermittent asthma, uncomplicated: Secondary | ICD-10-CM

## 2024-01-25 MED ORDER — RYALTRIS 665-25 MCG/ACT NA SUSP: NASAL | 3 refills | Status: DC

## 2024-01-25 MED ORDER — OLOPATADINE HCL 0.2 % OP SOLN: OPHTHALMIC | 3 refills | Status: DC

## 2024-01-25 MED ORDER — LEVOCETIRIZINE DIHYDROCHLORIDE 2.5 MG/5ML PO SOLN
5.0000 mg | Freq: Every evening | ORAL | 5 refills | Status: DC
Start: 2024-01-25 — End: 2024-07-06
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
## 2024-01-25 NOTE — Patient Instructions (Addendum)
 Reactive airway Smoke exposures trigger respiratory symptoms that improve with albuterol use.  - Have access to albuterol inhaler 2 puffs every 4-6 hours as needed for cough/wheeze/shortness of breath/chest tightness.  May use 15-20 minutes prior to activity.   Monitor frequency of use.   - If you have more smoke exposures on a more frequent basis then you would benefit at that time from a maintenance based inhaler medication.  - Lung function today is normal  Environmental allergies Chronic environmental allergies with perennial symptoms exacerbated seasonally. Zyrtec causes drowsiness. Discussed allergy immunotherapy as a long-term solution. Allergy testing will guide avoidance strategies. - Perform skin testing for environmental allergens.  Hold antihistamines for 3 days prior to testing.  - Prescribe Ryaltris nasal spray for rhinorrhea and nasal congestion.  Ryaltris is a combination nasal spray with Mometasone  for congestion control and Olopatadine for drainage control.   Use 2 sprays each nostril twice a day as needed for runny or stuffy nose.  With using nasal sprays point tip of bottle toward eye on same side nostril and lean head slightly forward for best technique.   - Recommend Allegra 180mg  or Xyzal  5mg  liquid as your antihistamine.  These options have the least sedating properties - Use Pataday 1 drop each eye daily as needed for itchy/watery eyes.    Contact dermatitis Contact dermatitis with known triggers including Neosporin and adhesive tapes. Discussed patch testing to identify additional allergens. Advised against Neosporin and bacitracin use. - patch testing is the test of choice to evaluate for contact dermatitis.  Recommend performing patch testing with the TRUE test patch panels.  Patches are best placed on a Monday with return to office on Wednesday and Friday of same week for readings.  Once patches are in place to do not get them wet.  You can take antihistamines while  patches are in place.   True Test looks for the following sensitivities:     Schedule skin testing visit Routine follow-up in 3-4 months or sooner if needed

## 2024-01-26 ENCOUNTER — Encounter: Payer: Self-pay | Admitting: Allergy

## 2024-01-26 DIAGNOSIS — Z713 Dietary counseling and surveillance: Secondary | ICD-10-CM | POA: Diagnosis not present

## 2024-02-02 ENCOUNTER — Other Ambulatory Visit: Payer: Self-pay | Admitting: Family Medicine

## 2024-02-02 ENCOUNTER — Other Ambulatory Visit (INDEPENDENT_AMBULATORY_CARE_PROVIDER_SITE_OTHER)

## 2024-02-02 ENCOUNTER — Ambulatory Visit (HOSPITAL_BASED_OUTPATIENT_CLINIC_OR_DEPARTMENT_OTHER): Payer: BC Managed Care – PPO | Admitting: Obstetrics & Gynecology

## 2024-02-02 DIAGNOSIS — Z713 Dietary counseling and surveillance: Secondary | ICD-10-CM | POA: Diagnosis not present

## 2024-02-02 DIAGNOSIS — E782 Mixed hyperlipidemia: Secondary | ICD-10-CM | POA: Diagnosis not present

## 2024-02-02 LAB — LIPID PANEL
Cholesterol: 245 mg/dL — ABNORMAL HIGH (ref 0–200)
HDL: 107.4 mg/dL (ref >39.00)
LDL Cholesterol: 127 mg/dL — ABNORMAL HIGH (ref 0–99)
NonHDL: 138.09
Total CHOL/HDL Ratio: 2
Triglycerides: 54 mg/dL (ref 0.0–149.0)
VLDL: 10.8 mg/dL (ref 0.0–40.0)

## 2024-02-03 ENCOUNTER — Ambulatory Visit: Payer: Self-pay | Admitting: Family Medicine

## 2024-02-08 ENCOUNTER — Ambulatory Visit: Admitting: Allergy

## 2024-02-09 DIAGNOSIS — Z713 Dietary counseling and surveillance: Secondary | ICD-10-CM | POA: Diagnosis not present

## 2024-02-16 ENCOUNTER — Encounter: Payer: Self-pay | Admitting: Hematology and Oncology

## 2024-02-23 ENCOUNTER — Encounter: Payer: Self-pay | Admitting: Allergy

## 2024-02-23 ENCOUNTER — Ambulatory Visit (INDEPENDENT_AMBULATORY_CARE_PROVIDER_SITE_OTHER): Admitting: Allergy

## 2024-02-23 DIAGNOSIS — J3089 Other allergic rhinitis: Secondary | ICD-10-CM | POA: Diagnosis not present

## 2024-02-23 DIAGNOSIS — J302 Other seasonal allergic rhinitis: Secondary | ICD-10-CM | POA: Diagnosis not present

## 2024-02-23 DIAGNOSIS — H1013 Acute atopic conjunctivitis, bilateral: Secondary | ICD-10-CM

## 2024-02-23 NOTE — Patient Instructions (Signed)
 Reactive airway Smoke exposures trigger respiratory symptoms that improve with albuterol use.  - Have access to albuterol inhaler 2 puffs every 4-6 hours as needed for cough/wheeze/shortness of breath/chest tightness.  May use 15-20 minutes prior to activity.   Monitor frequency of use.   - If you have more smoke exposures on a more frequent basis then you would benefit at that time from a maintenance based inhaler medication.   Environmental allergies Chronic environmental allergies with perennial symptoms exacerbated seasonally. Zyrtec causes drowsiness. Discussed allergy immunotherapy as a long-term solution.  - Testing today showed: grasses, trees, indoor molds, and cat - Copy of test results provided.  - Avoidance measures provided. - Consider allergy shots as a means of long-term control. - Allergy shots re-train and reset the immune system to ignore environmental allergens and decrease the resulting immune response to those allergens (sneezing, itchy watery eyes, runny nose, nasal congestion, etc).    - Allergy shots improve symptoms in 75-85% of patients.  - We can discuss more at the next appointment if the medications are not working for you. - Use Ryaltris  nasal spray for rhinorrhea and nasal congestion.  Ryaltris  is a combination nasal spray with Mometasone  for congestion control and Olopatadine  for drainage control.   Use 2 sprays each nostril twice a day as needed for runny or stuffy nose.  With using nasal sprays point tip of bottle toward eye on same side nostril and lean head slightly forward for best technique.   - Recommend Allegra 180mg  or Xyzal  5mg  liquid as your antihistamine.  These options have the least sedating properties - Use Pataday  1 drop each eye daily as needed for itchy/watery eyes.    Contact dermatitis Contact dermatitis with known triggers including Neosporin and adhesive tapes. Discussed patch testing to identify additional allergens. Advised against  Neosporin and bacitracin use. - patch testing is the test of choice to evaluate for contact dermatitis.  Recommend performing patch testing.  Patches are best placed on a Monday with return to office on Wednesday and Friday of same week for readings.  Once patches are in place to do not get them wet.  You can take antihistamines while patches are in place.  You can schedule for patch testing when convenient for you.    Routine follow-up in 3-4 months or sooner if needed

## 2024-02-23 NOTE — Progress Notes (Signed)
 Follow-up Note  RE: [name] MRN: [id number] DOB: [birth year] Date of Office Visit: 02/23/2024   History of present illness: [name] is a 48 y.o. female presenting today for skin testing visit.  She was last seen in the office on 12/28/23 for rhinoconjunctivitis.  She is in her usual state of health today without recent illness.  She has held antihistamines for at least 3 days for testing today.   Medication List: Current Outpatient Medications  Medication Sig Dispense Refill   albuterol (VENTOLIN HFA) 108 (90 Base) MCG/ACT inhaler Inhale 2 puffs into the lungs every 4 (four) hours as needed.     azelastine  (ASTELIN ) 0.1 % nasal spray Place 2 sprays into both nostrils 2 (two) times daily. Use in each nostril as directed 30 mL 12   Biotin 10 MG CAPS Take 10,000 mcg/day by mouth.     Cholecalciferol (D3 ADULT PO) Take by mouth. gummies     Fremanezumab -vfrm (AJOVY ) 225 MG/1.5ML SOAJ INJECT 225 MG INTO THE SKIN EVERY 28 (TWENTY-EIGHT) DAYS. 1 mL 11   levocetirizine (XYZAL ) 2.5 MG/5ML solution Take 10 mLs (5 mg total) by mouth every evening. 148 mL 5   MAGNESIUM PO Take by mouth 2 (two) times daily. Ionic Magnesium Liquid Extract     Multiple Vitamins-Minerals (ONE-A-DAY WOMENS PO) Take by mouth.     NON FORMULARY Barlean's fish oil     NON FORMULARY Glucosamine chondrotin     NON FORMULARY Organic Plant Protein     NON FORMULARY Liquid Turmeric- Plant Organics     NON FORMULARY Vibrant health-Green Vibrance     NON FORMULARY Force factor-total Beets     NON FORMULARY Vita fusion gummies- B12     NON FORMULARY Sambuscus Black Elderberry Extract     NON FORMULARY Calm Gummies Magnesium Supplement     NON FORMULARY Rise up Energy & Focus Gummies w/ Alpha GPC Lion's Mane & Aswagandha     NON FORMULARY 100 mg. CoQ10 Gummies     NON FORMULARY Collagen Peptides Grass-Fed Powder     nystatin  cream (MYCOSTATIN ) Apply 1 application  topically 2 (two) times daily.     Olopatadine   HCl (PATADAY ) 0.2 % SOLN 1 drop each eye daily as needed for itchy/watery eyes. 2.5 mL 3   Olopatadine -Mometasone  (RYALTRIS ) 665-25 MCG/ACT SUSP 2 sprays each nostril twice a day as needed for runny or stuffy nose. 29 g 3   Rimegepant Sulfate (NURTEC) 75 MG TBDP Take 1 tablet (75 mg total) by mouth daily as needed. 16 tablet 11   No current facility-administered medications for this visit.     Known medication allergies: Allergies  Allergen Reactions   Neosporin [Neomycin-Bacitracin Zn-Polymyx]     itching   Tape     Bandaids, surgical tape-itching    Diagnostics/Labs:  Allergy testing:   Airborne Adult Perc - 02/23/24 0848     Time Antigen Placed 4782    Allergen Manufacturer Floyd Hutchinson    Location Back    Number of Test 55    1. Control-Buffer 50% Glycerol Negative    2. Control-Histamine 2+    3. Bahia Negative    4. [place] Negative    5. Johnson Negative    6. [place]  Blue 2+    7. Meadow Fescue 2+    8. Perennial Rye Negative    9. Timothy Negative    10. Ragweed Mix Negative    11. Cocklebur Negative    12. Plantain,  English Negative  13. Baccharis Negative    14. Dog Fennel Negative    15. Russian Thistle Negative    16. Lamb's Quarters Negative    17. Sheep Sorrell Negative    18. Rough Pigweed Negative    19. Marsh Elder, Rough Negative    20. Mugwort, Common Negative    21. Box, Elder Negative    22. Cedar, red Negative    23. Sweet Gum Negative    24. Pecan Pollen Negative    25. Pine Mix Negative    26. Walnut, Black Pollen Negative    27. Red Mulberry Negative    28. Ash Mix Negative    29. Birch Mix 2+    30. Beech American Negative    31. Cottonwood, [place] 2+    32. Hickory, White Negative    33. Maple Mix Negative    34. Oak, [place] Mix 3+    35. Sycamore Eastern Negative    36. Alternaria Alternata Negative    37. Cladosporium Herbarum Negative    38. Aspergillus Mix Negative    39. Penicillium Mix 2+    40. Bipolaris Sorokiniana  (Helminthosporium) Negative    41. Drechslera Spicifera (Curvularia) Negative    42. Mucor Plumbeus Negative    43. Fusarium Moniliforme Negative    44. Aureobasidium Pullulans (pullulara) Negative    45. Rhizopus Oryzae Negative    46. Botrytis Cinera Negative    47. Epicoccum Nigrum Negative    48. Phoma Betae Negative    49. Dust Mite Mix Negative    50. Cat Hair 10,000 BAU/ml 2+    51.  Dog Epithelia Negative    52. Mixed Feathers Negative    53. Horse Epithelia Negative    54. Cockroach, German Negative    55. Tobacco Leaf Negative          Allergy testing results were read and interpreted by provider, documented by clinical staff.  Assessment and plan:   Reactive airway Smoke exposures trigger respiratory symptoms that improve with albuterol use.  - Have access to albuterol inhaler 2 puffs every 4-6 hours as needed for cough/wheeze/shortness of breath/chest tightness.  May use 15-20 minutes prior to activity.   Monitor frequency of use.   - If you have more smoke exposures on a more frequent basis then you would benefit at that time from a maintenance based inhaler medication.   Environmental allergies Chronic environmental allergies with perennial symptoms exacerbated seasonally. Zyrtec causes drowsiness. Discussed allergy immunotherapy as a long-term solution.  - Testing today showed: grasses, trees, indoor molds, and cat - Copy of test results provided.  - Avoidance measures provided. - Consider allergy shots as a means of long-term control. - Allergy shots re-train and reset the immune system to ignore environmental allergens and decrease the resulting immune response to those allergens (sneezing, itchy watery eyes, runny nose, nasal congestion, etc).    - Allergy shots improve symptoms in 75-85% of patients.  - We can discuss more at the next appointment if the medications are not working for you. - Use Ryaltris  nasal spray for rhinorrhea and nasal congestion.   Ryaltris  is a combination nasal spray with Mometasone  for congestion control and Olopatadine  for drainage control.   Use 2 sprays each nostril twice a day as needed for runny or stuffy nose.  With using nasal sprays point tip of bottle toward eye on same side nostril and lean head slightly forward for best technique.   - Recommend Allegra 180mg  or Xyzal  5mg   liquid as your antihistamine.  These options have the least sedating properties - Use Pataday  1 drop each eye daily as needed for itchy/watery eyes.    Contact dermatitis Contact dermatitis with known triggers including Neosporin and adhesive tapes. Discussed patch testing to identify additional allergens. Advised against Neosporin and bacitracin use. - patch testing is the test of choice to evaluate for contact dermatitis.  Recommend performing patch testing.  Patches are best placed on a Monday with return to office on Wednesday and Friday of same week for readings.  Once patches are in place to do not get them wet.  You can take antihistamines while patches are in place.  You can schedule for patch testing when convenient for you.    Routine follow-up in 3-4 months or sooner if needed  I appreciate the opportunity to take part in Lysa's care. Please do not hesitate to contact me with questions.  Sincerely,   Catha Clink, MD Allergy/Immunology [facility]

## 2024-02-27 ENCOUNTER — Telehealth: Payer: Self-pay | Admitting: Allergy

## 2024-02-27 ENCOUNTER — Encounter: Payer: Self-pay | Admitting: Allergy

## 2024-02-27 NOTE — Telephone Encounter (Signed)
 I called JoVan to schedule NAC 80.  I left a voicemail asking her to call back and schedule.

## 2024-02-28 DIAGNOSIS — M25562 Pain in left knee: Secondary | ICD-10-CM | POA: Diagnosis not present

## 2024-02-28 DIAGNOSIS — M25561 Pain in right knee: Secondary | ICD-10-CM | POA: Diagnosis not present

## 2024-02-28 DIAGNOSIS — M25551 Pain in right hip: Secondary | ICD-10-CM | POA: Diagnosis not present

## 2024-02-28 DIAGNOSIS — M25522 Pain in left elbow: Secondary | ICD-10-CM | POA: Diagnosis not present

## 2024-03-02 DIAGNOSIS — G43001 Migraine without aura, not intractable, with status migrainosus: Secondary | ICD-10-CM | POA: Diagnosis not present

## 2024-03-02 DIAGNOSIS — R293 Abnormal posture: Secondary | ICD-10-CM | POA: Diagnosis not present

## 2024-03-02 DIAGNOSIS — M9902 Segmental and somatic dysfunction of thoracic region: Secondary | ICD-10-CM | POA: Diagnosis not present

## 2024-03-02 DIAGNOSIS — M9901 Segmental and somatic dysfunction of cervical region: Secondary | ICD-10-CM | POA: Diagnosis not present

## 2024-03-05 DIAGNOSIS — Z713 Dietary counseling and surveillance: Secondary | ICD-10-CM | POA: Diagnosis not present

## 2024-03-13 DIAGNOSIS — M25522 Pain in left elbow: Secondary | ICD-10-CM | POA: Diagnosis not present

## 2024-03-26 ENCOUNTER — Encounter: Admitting: Family Medicine

## 2024-03-28 ENCOUNTER — Encounter: Admitting: Family

## 2024-03-30 ENCOUNTER — Ambulatory Visit (INDEPENDENT_AMBULATORY_CARE_PROVIDER_SITE_OTHER): Admitting: Otolaryngology

## 2024-03-30 VITALS — BP 142/83 | HR 73

## 2024-03-30 DIAGNOSIS — J3089 Other allergic rhinitis: Secondary | ICD-10-CM

## 2024-03-30 DIAGNOSIS — J343 Hypertrophy of nasal turbinates: Secondary | ICD-10-CM

## 2024-03-30 DIAGNOSIS — R293 Abnormal posture: Secondary | ICD-10-CM | POA: Diagnosis not present

## 2024-03-30 DIAGNOSIS — L259 Unspecified contact dermatitis, unspecified cause: Secondary | ICD-10-CM

## 2024-03-30 DIAGNOSIS — R0981 Nasal congestion: Secondary | ICD-10-CM | POA: Diagnosis not present

## 2024-03-30 DIAGNOSIS — J342 Deviated nasal septum: Secondary | ICD-10-CM | POA: Diagnosis not present

## 2024-03-30 DIAGNOSIS — M9902 Segmental and somatic dysfunction of thoracic region: Secondary | ICD-10-CM | POA: Diagnosis not present

## 2024-03-30 DIAGNOSIS — G43009 Migraine without aura, not intractable, without status migrainosus: Secondary | ICD-10-CM

## 2024-03-30 DIAGNOSIS — G43001 Migraine without aura, not intractable, with status migrainosus: Secondary | ICD-10-CM | POA: Diagnosis not present

## 2024-03-30 DIAGNOSIS — R0982 Postnasal drip: Secondary | ICD-10-CM

## 2024-03-30 DIAGNOSIS — M9901 Segmental and somatic dysfunction of cervical region: Secondary | ICD-10-CM | POA: Diagnosis not present

## 2024-03-30 NOTE — Progress Notes (Signed)
 Follow-up Note  RE: [name] MRN: [id number] DOB: 07/14/1976 Date of Office Visit: 04/02/2024  Primary care provider: Mercer Clotilda SAUNDERS, MD Referring provider: Mercer Clotilda SAUNDERS, MD   [name] returns to the office today for the patch test placement, given suspected history of contact dermatitis. Care of patches, specifically to avoid moisture, was discussed in detail and all questions were answered.    Diagnostics: NAC 80 patches placed NAC-80 (1-80)   1. Ammonium persulfate  2. [place] Balsam  3. Omitted  4. 4-tert-Butylphenolformaldehyde resin (PTBP)  5. Bacitracin  6. Budesonide  7. Quaternium-15  8. Cinnamal  9. Cobalt(II) chloride hexahydrate  10. Colophonium  11. Methyldibromo glutaronitrile  12. Decyl Glucoside  13. Ethylenediamine dihydrochloride  14. 2-Hydroxyethyl methacrylate  15. Hydroperoxides of Linalool  16. Iodopropynyl butylcarbamate  17. 2-Mercaptobenzothiazole (MBT)  18. Thiuram mix  19. METHYLISOTHIAZOLINONE  20. Propylene glycol  21. 1,3-Diphenylguanidine  22. Hydroperoxides of Limonene  23. Black rubber mix  24. Carba mix  25. Fragrance mix I  26. Fragrance mix II  27. Textile dye mix II  28. Neomycin sulfate  29. Nickel(II) sulfate hexahydrate  30. p-Phenylenediamine (PPD)  31. Potassium dichromate  32. Propolis  33. Sodium Metabisulfite  34. Tixocortol-21-pivalate  35. Lanolin alcohol  36. Methylisothiazolinone + Methylchloroisothiazolinone  37. Cocamidopropyl betaine  38. 3-(Dimethylamino)-1-propylamine  39. Formaldehyde  40. Oleamidopropyl dimethylamine  41. 2-Bromo-2-Nitropropane-l,3-diol  42. Diazolidinyl urea  43. DMDM Hydantoin  44. Epoxy resin, Bisphenol A  45. Benzophenone-4  46. Imidazolidinyl urea  47. Lauryl polyglucose  48 Methyl methacrylate  49. Paraben mix  50. Mercapto mix  51. Caine mix III  52. Mixed dialkyl thiourea  53. Compositae mix II  54. Toluenesulfonamide formaldehyde resin  55. Tea Tree Oil oxidized   56. Ylang-Ylang oil  57. Amidoamine  58. Amerchol L 101  59. Benzocaine  60. Benzyl alchohol  61. Benzyl salicylate  62. Chloroxylenol (PCMX)  63. Cocamide DEA  64. Clobetasol-17-propionate  65. Toluene-2,5-Diamine sulfate  66. Ethyl acrylate  67. N-Isopropyl-N-phenyl--4-phenylenediamine (IPPD)  68. Lidocaine   69. Omitted  70. Sesquiterpene lactone mix  71. 2-n-Octyl-4-isothiazolin-3-one  72. Propyl gallate  73. Polymyxin B sulfate  74. Pramoxine hydrochloride  75. Sodium benzoate  76. Sorbitan oleate  77. Sorbitan sesquioleate  78. Tocopherol  79. BENZALKONIUM CHLORIDE  80. Chlorhexidine digluconate    Allergic contact dermatitis - Instructions provided on care of the patches for the next 48 hours. - [name] was instructed to avoid showering for the next 48 hours. - [name] will follow up in 48 hours and 96 hours for patch readings.    Call the clinic if this treatment plan is not working well for you  Follow up in 2 days or sooner if needed.
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
## 2024-04-02 ENCOUNTER — Ambulatory Visit (INDEPENDENT_AMBULATORY_CARE_PROVIDER_SITE_OTHER): Admitting: Family Medicine

## 2024-04-02 ENCOUNTER — Encounter: Payer: Self-pay | Admitting: Family Medicine

## 2024-04-02 DIAGNOSIS — L235 Allergic contact dermatitis due to other chemical products: Secondary | ICD-10-CM

## 2024-04-02 DIAGNOSIS — L259 Unspecified contact dermatitis, unspecified cause: Secondary | ICD-10-CM | POA: Insufficient documentation

## 2024-04-02 NOTE — Patient Instructions (Signed)
 Diagnostics: NAC 80 patches placed NAC-80 (1-80)   1. Ammonium persulfate  2. [place] Balsam  3. Omitted  4. 4-tert-Butylphenolformaldehyde resin (PTBP)  5. Bacitracin  6. Budesonide  7. Quaternium-15  8. Cinnamal  9. Cobalt(II) chloride hexahydrate  10. Colophonium  11. Methyldibromo glutaronitrile  12. Decyl Glucoside  13. Ethylenediamine dihydrochloride  14. 2-Hydroxyethyl methacrylate  15. Hydroperoxides of Linalool  16. Iodopropynyl butylcarbamate  17. 2-Mercaptobenzothiazole (MBT)  18. Thiuram mix  19. METHYLISOTHIAZOLINONE  20. Propylene glycol  21. 1,3-Diphenylguanidine  22. Hydroperoxides of Limonene  23. Black rubber mix  24. Carba mix  25. Fragrance mix I  26. Fragrance mix II  27. Textile dye mix II  28. Neomycin sulfate  29. Nickel(II) sulfate hexahydrate  30. p-Phenylenediamine (PPD)  31. Potassium dichromate  32. Propolis  33. Sodium Metabisulfite  34. Tixocortol-21-pivalate  35. Lanolin alcohol  36. Methylisothiazolinone + Methylchloroisothiazolinone  37. Cocamidopropyl betaine  38. 3-(Dimethylamino)-1-propylamine  39. Formaldehyde  40. Oleamidopropyl dimethylamine  41. 2-Bromo-2-Nitropropane-l,3-diol  42. Diazolidinyl urea  43. DMDM Hydantoin  44. Epoxy resin, Bisphenol A  45. Benzophenone-4  46. Imidazolidinyl urea  47. Lauryl polyglucose  48 Methyl methacrylate  49. Paraben mix  50. Mercapto mix  51. Caine mix III  52. Mixed dialkyl thiourea  53. Compositae mix II  54. Toluenesulfonamide formaldehyde resin  55. Tea Tree Oil oxidized  56. Ylang-Ylang oil  57. Amidoamine  58. Amerchol L 101  59. Benzocaine  60. Benzyl alchohol  61. Benzyl salicylate  62. Chloroxylenol (PCMX)  63. Cocamide DEA  64. Clobetasol-17-propionate  65. Toluene-2,5-Diamine sulfate  66. Ethyl acrylate  67. N-Isopropyl-N-phenyl--4-phenylenediamine (IPPD)  68. Lidocaine   69. omitted  70. Sesquiterpene lactone mix  71. 2-n-Octyl-4-isothiazolin-3-one  72. Propyl  gallate  73. Polymyxin B sulfate  74. Pramoxine hydrochloride  75. Sodium benzoate  76. Sorbitan oleate  77. Sorbitan sesquioleate  78. Tocopherol  79. BENZALKONIUM CHLORIDE  80. Chlorhexidine digluconate    Allergic contact dermatitis - Instructions provided on care of the patches for the next 48 hours. - Kaylena was instructed to avoid showering for the next 48 hours. - [place] will follow up in 48 hours and 96 hours for patch readings.    Call the clinic if this treatment plan is not working well for you  Follow up in 2 days or sooner if needed.

## 2024-04-04 ENCOUNTER — Encounter: Payer: Self-pay | Admitting: Family

## 2024-04-04 ENCOUNTER — Ambulatory Visit (INDEPENDENT_AMBULATORY_CARE_PROVIDER_SITE_OTHER): Admitting: Family

## 2024-04-04 DIAGNOSIS — L235 Allergic contact dermatitis due to other chemical products: Secondary | ICD-10-CM

## 2024-04-04 NOTE — Progress Notes (Signed)
 [name] returns to the office today for the 48 hour patch test interpretation, given suspected history of contact dermatitis.    Diagnostics:   NAC-80 48-hour hour reading: all negative. Portions of panel 7 and panel 8 were not attached to skin   Plan:   Allergic contact dermatitis - Okay to take over-the-counter antihistamines such as Claritin (loratadine), Xyzal  (levocetirizine), Zyrtec (cetirizine), or Allegra (fexofenadine) once a day as needed for itching - Okay to shower now, but do not scrub or get soap on your back. - Keep final patch test reading appointment this Friday  Wanda Craze, FNP [facility]

## 2024-04-06 ENCOUNTER — Ambulatory Visit: Admitting: Internal Medicine

## 2024-04-06 DIAGNOSIS — L235 Allergic contact dermatitis due to other chemical products: Secondary | ICD-10-CM | POA: Diagnosis not present

## 2024-04-06 NOTE — Progress Notes (Signed)
   Follow Up Note  RE: [name] MRN: [id number] DOB: 03-15-1976 Date of Office Visit: 04/06/2024  Referring provider: Mercer Clotilda SAUNDERS, MD Primary care provider: Mercer Clotilda SAUNDERS, MD  History of Present Illness: I had the pleasure of seeing [name] for a follow up visit at the [facility] on 04/06/2024. She is a 48 y.o. female, who is being followed for contact dermatitis. Today she is here for final patch test interpretation, given suspected history of contact dermatitis.   Diagnostics:  NAC-80 final read: all negative. Of note, from previous visit, portions of panel 7 and panel 8 were not attached to skin     Assessment and Plan: [name] is a 48 y.o. female with: Concern for Contact Dermatitis: If rashes persist, can consider redoing panel 7-8.  The patient has been provided detailed information regarding the substances she is sensitive to, as well as products containing the substances.  Meticulous avoidance of these substances is recommended. If avoidance is not possible, the use of barrier creams or lotions is recommended. If symptoms persist or progress despite meticulous avoidance of chemicals/substances above, dermatology evaluation may be warranted.  It was my pleasure to see [name] today and participate in her care. Please feel free to contact me with any questions or concerns.  Sincerely,   Arleta Blanch, MD [facility]

## 2024-04-07 DIAGNOSIS — M25522 Pain in left elbow: Secondary | ICD-10-CM | POA: Diagnosis not present

## 2024-04-09 ENCOUNTER — Ambulatory Visit (INDEPENDENT_AMBULATORY_CARE_PROVIDER_SITE_OTHER): Admitting: Family Medicine

## 2024-04-09 ENCOUNTER — Encounter: Payer: Self-pay | Admitting: Family Medicine

## 2024-04-09 VITALS — BP 126/78 | HR 88 | Temp 98.4°F | Ht 64.0 in | Wt 223.6 lb

## 2024-04-09 DIAGNOSIS — E782 Mixed hyperlipidemia: Secondary | ICD-10-CM

## 2024-04-09 DIAGNOSIS — D500 Iron deficiency anemia secondary to blood loss (chronic): Secondary | ICD-10-CM

## 2024-04-09 DIAGNOSIS — J3089 Other allergic rhinitis: Secondary | ICD-10-CM

## 2024-04-09 DIAGNOSIS — F339 Major depressive disorder, recurrent, unspecified: Secondary | ICD-10-CM

## 2024-04-09 DIAGNOSIS — E66812 Obesity, class 2: Secondary | ICD-10-CM

## 2024-04-09 DIAGNOSIS — G43709 Chronic migraine without aura, not intractable, without status migrainosus: Secondary | ICD-10-CM

## 2024-04-09 DIAGNOSIS — Z6838 Body mass index (BMI) 38.0-38.9, adult: Secondary | ICD-10-CM
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
## 2024-04-10 ENCOUNTER — Ambulatory Visit (HOSPITAL_BASED_OUTPATIENT_CLINIC_OR_DEPARTMENT_OTHER): Admitting: Obstetrics & Gynecology

## 2024-04-10 DIAGNOSIS — M25522 Pain in left elbow: Secondary | ICD-10-CM | POA: Diagnosis not present

## 2024-04-11 ENCOUNTER — Encounter (HOSPITAL_BASED_OUTPATIENT_CLINIC_OR_DEPARTMENT_OTHER): Payer: Self-pay | Admitting: Cardiology

## 2024-04-11 ENCOUNTER — Ambulatory Visit (INDEPENDENT_AMBULATORY_CARE_PROVIDER_SITE_OTHER): Admitting: Cardiology

## 2024-04-11 VITALS — BP 108/58 | HR 78 | Ht 62.5 in | Wt 222.9 lb

## 2024-04-11 DIAGNOSIS — Z7189 Other specified counseling: Secondary | ICD-10-CM

## 2024-04-11 DIAGNOSIS — R0789 Other chest pain: Secondary | ICD-10-CM | POA: Diagnosis not present

## 2024-04-11 DIAGNOSIS — E7800 Pure hypercholesterolemia, unspecified: Secondary | ICD-10-CM | POA: Diagnosis not present

## 2024-04-11 DIAGNOSIS — I872 Venous insufficiency (chronic) (peripheral): Secondary | ICD-10-CM

## 2024-04-11 NOTE — Patient Instructions (Addendum)
 Medication Instructions:  Your physician recommends that you continue on your current medications as directed. Please refer to the Current Medication list given to you today.  *If you need a refill on your cardiac medications before your next appointment, please call your pharmacy*  Lab Work: NONE  Testing/Procedures: NONE  Follow-Up: At [facility], you and your health needs are our priority.  As part of our continuing mission to provide you with exceptional heart care, we have created designated Provider Care Teams.  These Care Teams include your primary Cardiologist (physician) and Advanced Practice Providers (APPs -  Physician Assistants and Nurse Practitioners) who all work together to provide you with the care you need, when you need it.  We recommend signing up for the patient portal called MyChart.  Sign up information is provided on this After Visit Summary.  MyChart is used to connect with patients for Virtual Visits (Telemedicine).  Patients are able to view lab/test results, encounter notes, upcoming appointments, etc.  Non-urgent messages can be sent to your provider as well.   To learn more about what you can do with MyChart, go to [email or web address].    Your next appointment:   6 month(s)  The format for your next appointment:   In Person  Provider:   DR CHRISTOPHER, CAITLIN W NP OR MICHELLE S NP      Symptoms to watch for: SEVERE and SUSTAINED -pain severe enough that it limits you from being able to function  -symptoms that last a long time without improvement (not better or worsening, despite your usual management strategies). Not a specific amount of time, but if worsening/progressing for more than 30 minutes or so, then seek emergency medical attention  For electrolytes: look for low sodium, but has potassium and magnesium  Look into compression socks (athletic type are cotton based, like Dr. Heriberto or Dr. Motion)  Look into [employer]  for fitted compression stockings: [address] [place], [address] [place], [place] [address] [phone] [email or web address]   For diet: try to keep it to 2 yolks/day (ok to have 2 full eggs in AM but if having boiled egg later, take the yolk out). White cheese typically has less cholesterol/saturated fat than yellow cheese)

## 2024-04-11 NOTE — Progress Notes (Signed)
 Cardiology Office Note:  .   Date:  04/11/2024  ID:  [name], DOB 05-04-1976, MRN [id number] PCP: Mercer Clotilda SAUNDERS, MD  [facility] Providers Cardiologist:  Shelda Bruckner, MD {  History of Present Illness: .   [name] is a 48 y.o. female with PMH morbid obesity (peak weight 340 lbs) s/p weight loss, chest pain, LE edema seen for follow up. She was initially seen 06/22/23 further evaluation of the above.  CV history: Ca score 2024 was 0. History of complicated migraines, which can resemble strokes. Can have syncope. Has trialed multiple meds, many have side effects.   Has intermittent LE edema, has seen the vein team. Prior venous dopplers on left side without clot, venous reflux noted in left sapheno-femoral junction. Reviewed notes from Dr. Magda, felt to be related to chronic venous/lymphatic insufficiency.  Mother had complications around the time of patient's birth, was told she was close to having a stroke. Grandmother has stents. Multiple family members with high blood pressure.  Peak weight was 340 lbs, lost 170 lbs at her nadir.    Today: Has had a lot of life/work stress and anxiety. Migraines have also been worse. Still having intermittent chest pain when she thinks about work. Pain is sharp, central chest, feels like she can't take a deep breath. Gets better with taking deep breaths, or if she gets up/moves/distracts herself. Happening daily, lasts anywhere from 3-5 minutes for severe episodes, occasionally longer. Feels very similar to severe panic attacks she has been hospitalized in the past. Has been following longer term with a therapist that has been very helpful.  Reviewed her CV risk, recent lipids. Works out routinely. LE edema worse at the end of the day, tries to elevated legs, normal by next morning. Does get leg cramps at night, takes electrolytes, discussed what to look for in those.  ROS: Denies shortness of breath at rest or with normal  exertion. No PND, orthopnea, or unexpected weight gain. No syncope or palpitations. ROS otherwise negative except as noted.   Studies Reviewed: SABRA    EKG:       Physical Exam:   VS:  BP (!) 108/58   Pulse 78   Ht 5' 2.5 (1.588 m)   Wt 222 lb 14.4 oz (101.1 kg)   LMP 09/06/2018 (Approximate)   BMI 40.12 kg/m    Wt Readings from Last 3 Encounters:  04/11/24 222 lb 14.4 oz (101.1 kg)  04/09/24 223 lb 9.6 oz (101.4 kg)  01/25/24 219 lb (99.3 kg)    GEN: Well nourished, well developed in no acute distress HEENT: Normal, moist mucous membranes NECK: No JVD CARDIAC: regular rhythm, normal S1 and S2, no rubs or gallops. No murmur. VASCULAR: Radial and DP pulses 2+ bilaterally. No carotid bruits RESPIRATORY:  Clear to auscultation without rales, wheezing or rhonchi  ABDOMEN: Soft, non-tender, non-distended MUSCULOSKELETAL:  Ambulates independently SKIN: Warm and dry, no edema NEUROLOGIC:  Alert and oriented x 3. No focal neuro deficits noted. PSYCHIATRIC:  Normal affect    ASSESSMENT AND PLAN: .    Chest discomfort, atypical -CT coronary with calcium score of 0, Coronary CT 07/2023 no evidence of CAD, very reassuring -linked to her stress/anxiety, nonexertional -reviewed red flag warning signs that need immediate medical attention  Hypercholesterolemia -excellent HDL (>100) due to lifestyle. Discussed that we would not follow total cholesterol given high HDL, would follow LDL -TG 54 -LDL 127, reasonable for primary prevention and calcium score of 0 -discussed diet  recommendations. She likes eggs and cheese, discussed guideline  LE edema -well controlled, left chronically slightly more enlarged than right -has been seen by Dr. Magda, thought to be chronic venous insufficiency vs. Lymph insufficiency -vein issues run in her family -counseled on salt avoidance, compression, elevation  CV risk counseling and prevention -recommend heart healthy/Mediterranean diet, with whole  grains, fruits, vegetable, fish, lean meats, nuts, and olive oil. Limit salt. -recommend moderate walking, 3-5 times/week for 30-50 minutes each session. Aim for at least 150 minutes.week. Goal should be pace of 3 miles/hours, or walking 1.5 miles in 30 minutes -recommend avoidance of tobacco products. Avoid excess alcohol. -ASCVD risk score: The ASCVD Risk score (Arnett DK, et al., 2019) failed to calculate for the following reasons:   The valid HDL cholesterol range is 20 to 100 mg/dL    Dispo: 6 months or sooner as needed  Total time of encounter: I spent 44 minutes dedicated to the care of this patient on the date of this encounter to include pre-visit review of records, face-to-face time with the patient discussing conditions above, and clinical documentation with the electronic health record. We specifically spent time today discussing chest pain, test results, lifestyle guidelines, signs/symptoms to watch for   Signed, Shelda Bruckner, MD   Shelda Bruckner, MD, PhD, [facility]  [facility]  [place]  [facility] V[facility] at [facility] at [facility] [address], [address] [place], [place] [address] [phone]

## 2024-04-27 ENCOUNTER — Ambulatory Visit: Admitting: Allergy

## 2024-04-27 DIAGNOSIS — M9901 Segmental and somatic dysfunction of cervical region: Secondary | ICD-10-CM | POA: Diagnosis not present

## 2024-04-27 DIAGNOSIS — Z1231 Encounter for screening mammogram for malignant neoplasm of breast: Secondary | ICD-10-CM | POA: Diagnosis not present

## 2024-04-27 DIAGNOSIS — G43001 Migraine without aura, not intractable, with status migrainosus: Secondary | ICD-10-CM | POA: Diagnosis not present

## 2024-04-27 DIAGNOSIS — M9902 Segmental and somatic dysfunction of thoracic region: Secondary | ICD-10-CM | POA: Diagnosis not present

## 2024-04-27 DIAGNOSIS — R293 Abnormal posture: Secondary | ICD-10-CM | POA: Diagnosis not present

## 2024-04-27 LAB — HM MAMMOGRAPHY
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
## 2024-04-28 ENCOUNTER — Encounter (HOSPITAL_BASED_OUTPATIENT_CLINIC_OR_DEPARTMENT_OTHER): Payer: Self-pay

## 2024-04-30 ENCOUNTER — Encounter: Payer: Self-pay | Admitting: Hematology and Oncology

## 2024-04-30 ENCOUNTER — Encounter: Payer: Self-pay | Admitting: Neurology

## 2024-04-30 ENCOUNTER — Telehealth: Payer: Self-pay

## 2024-04-30 ENCOUNTER — Ambulatory Visit (INDEPENDENT_AMBULATORY_CARE_PROVIDER_SITE_OTHER): Payer: BC Managed Care – PPO | Admitting: Neurology

## 2024-04-30 ENCOUNTER — Other Ambulatory Visit (HOSPITAL_COMMUNITY): Payer: Self-pay

## 2024-04-30 VITALS — BP 138/83 | HR 76 | Ht 60.0 in | Wt 224.0 lb

## 2024-04-30 DIAGNOSIS — G43109 Migraine with aura, not intractable, without status migrainosus: Secondary | ICD-10-CM | POA: Diagnosis not present

## 2024-04-30 MED ORDER — NURTEC 75 MG PO TBDP: 75.0000 mg | ORAL_TABLET | Freq: Every day | ORAL | 11 refills | Status: AC | PRN

## 2024-04-30 NOTE — Telephone Encounter (Signed)
 PA needed for Nurtec.

## 2024-04-30 NOTE — Telephone Encounter (Signed)
 Trying to start a PA but the patient insurance is not finding with her name and date of birth. Request has name as written on insurance card provided in system - does patient know if they have her dob wrong?

## 2024-04-30 NOTE — Telephone Encounter (Signed)
 I am using exactly what is on the insurance card and the date of birth we have on file August 08, 1976. If I were to call the insurance to try and verify a birthday or coverage, and this is not the birthday they have on file, then they will not help me.

## 2024-05-01 ENCOUNTER — Encounter: Payer: Self-pay | Admitting: Family Medicine

## 2024-05-03 ENCOUNTER — Encounter (HOSPITAL_BASED_OUTPATIENT_CLINIC_OR_DEPARTMENT_OTHER): Payer: Self-pay | Admitting: Obstetrics & Gynecology

## 2024-05-03 ENCOUNTER — Ambulatory Visit (HOSPITAL_BASED_OUTPATIENT_CLINIC_OR_DEPARTMENT_OTHER): Admitting: Obstetrics & Gynecology

## 2024-05-03 VITALS — BP 135/83 | HR 58 | Wt 221.6 lb

## 2024-05-03 DIAGNOSIS — L732 Hidradenitis suppurativa: Secondary | ICD-10-CM

## 2024-05-03 DIAGNOSIS — Z01419 Encounter for gynecological examination (general) (routine) without abnormal findings: Secondary | ICD-10-CM | POA: Diagnosis not present

## 2024-05-03 DIAGNOSIS — D500 Iron deficiency anemia secondary to blood loss (chronic): Secondary | ICD-10-CM

## 2024-05-03 DIAGNOSIS — G43709 Chronic migraine without aura, not intractable, without status migrainosus: Secondary | ICD-10-CM | POA: Diagnosis not present

## 2024-05-03 NOTE — Progress Notes (Signed)
 ANNUAL EXAM Patient name: Carrissa Mattie MRN [id number]  Date of birth: Feb 03, 1976 Chief Complaint:   Annual Exam  History of Present Illness:   Laprecious Corney is a 48 y.o. G0P0 African-American female being seen today for a routine annual exam.  Denies vaginal bleeding.    Had MRI earlier this year.  Had partially empty sella turcica.  She did have TSH this year that was normal.  H/o earlier menopause with elevated FSH.    Current complaints: None  Patient's last menstrual period was 09/06/2018 (approximate).  Last pap 01/05/2022. Results were: NILM w/ HRHPV negative. H/O abnormal pap: yes remote hx Last mammogram: 04/27/2024. Results were: normal. Birads 1: negative. Family h/o breast cancer: yes maternal aunt and grandmother Last colonoscopy: 03/11/2022. Results were: normal. Family h/o colorectal cancer: no     04/09/2024   11:07 AM 12/09/2023    9:09 AM 07/20/2023    8:35 AM 03/02/2023    8:16 AM 01/19/2023    8:15 AM  Depression screen PHQ 2/9  Decreased Interest 1 0 1 0 0  Down, Depressed, Hopeless 1 0 1 0 0  PHQ - 2 Score 2 0 2 0 0  Altered sleeping 2 1 1     Tired, decreased energy 2 0 1    Change in appetite 2 0 1    Feeling bad or failure about yourself  0 0 1    Trouble concentrating 0 0 0    Moving slowly or fidgety/restless 0 0 0    Suicidal thoughts 0 0 0    PHQ-9 Score 8 1 6     Difficult doing work/chores  Not difficult at all Not difficult at all          04/09/2024   11:07 AM 12/09/2023    9:09 AM 07/20/2023    8:36 AM  GAD 7 : Generalized Anxiety Score  Nervous, Anxious, on Edge 2 1 0  Control/stop worrying 1 1 1   Worry too much - different things 0 1 1  Trouble relaxing 0 0 0  Restless 0 0 0  Easily annoyed or irritable 1 0 1  Afraid - awful might happen 0 0 1  Total GAD 7 Score 4 3 4   Anxiety Difficulty Not difficult at all Not difficult at all Not difficult at all    Review of Systems:   Pertinent items are noted in HPI Denies any urinary or bowel  changes.  Denies pelvic pain.   Pertinent History Reviewed:  Reviewed past medical,surgical, social and family history.  Reviewed problem list, medications and allergies. Physical Assessment:   Vitals:   05/03/24 1419  BP: 135/83  Pulse: (!) 58  SpO2: 100%  Weight: 221 lb 9.6 oz (100.5 kg)  Body mass index is 43.28 kg/m.        Physical Examination:   General appearance - well appearing, and in no distress  Mental status - alert, oriented to person, place, and time  Psych:  She has a normal mood and affect  Skin - warm and dry, normal color, no suspicious lesions noted  Chest - effort normal, all lung fields clear to auscultation bilaterally  Heart - normal rate and regular rhythm  Neck:  midline trachea, no thyromegaly or nodules  Breasts - breasts appear normal, no suspicious masses, no skin or nipple changes or  axillary nodes  Abdomen - soft, nontender, nondistended, no masses or organomegaly  Pelvic - VULVA: normal appearing vulva with no masses, tenderness or lesions  VAGINA: normal appearing vagina with normal color and discharge, no lesions   CERVIX: normal appearing cervix without discharge or lesions, no CMT  Thin prep pap is  UTERUS: uterus is felt to be normal size, shape, consistency and nontender   ADNEXA: No adnexal masses or tenderness noted.  Rectal - normal rectal, good sphincter tone, no masses felt.   Extremities:  No swelling or varicosities noted  Chaperone present for exam  No results found for this or any previous visit (from the past 24 hours).  Assessment & Plan:  1. Well woman exam with routine gynecological exam (Primary) - Pap smear 2023 with neg HR HPV.   Not indicated today. - Mammogram 04/2024 - Colonoscopy 2023.  Follow up 10 years.   - lab work done is done with Dr. Mercer. - vaccines reviewed/updated  2. Hidradenitis - under good control  3. Iron deficiency anemia secondary to blood loss (chronic) - most recent hb 10/2023 was  normal  4. Chronic migraine without aura without status migrainosus, not intractable - followed by Dr. Skeet   No orders of the defined types were placed in this encounter.   Meds: No orders of the defined types were placed in this encounter.   Follow-up: Return in about 1 year (around 05/03/2025).  Ronal GORMAN Pinal, MD 05/06/2024 9:15 PM

## 2024-05-06 ENCOUNTER — Encounter (HOSPITAL_BASED_OUTPATIENT_CLINIC_OR_DEPARTMENT_OTHER): Payer: Self-pay | Admitting: Obstetrics & Gynecology

## 2024-05-25 DIAGNOSIS — G43001 Migraine without aura, not intractable, with status migrainosus: Secondary | ICD-10-CM | POA: Diagnosis not present

## 2024-05-25 DIAGNOSIS — R293 Abnormal posture: Secondary | ICD-10-CM | POA: Diagnosis not present

## 2024-05-25 DIAGNOSIS — M9902 Segmental and somatic dysfunction of thoracic region: Secondary | ICD-10-CM | POA: Diagnosis not present

## 2024-05-25 DIAGNOSIS — M9901 Segmental and somatic dysfunction of cervical region: Secondary | ICD-10-CM | POA: Diagnosis not present

## 2024-05-31 ENCOUNTER — Encounter: Payer: Self-pay | Admitting: Hematology and Oncology

## 2024-05-31 ENCOUNTER — Other Ambulatory Visit (HOSPITAL_COMMUNITY): Payer: Self-pay

## 2024-05-31 NOTE — Telephone Encounter (Signed)
 PA has been submitted, and telephone encounter has been created. Please see telephone encounter dated 9.25.25.

## 2024-06-01 ENCOUNTER — Telehealth: Payer: Self-pay | Admitting: Pharmacy Technician

## 2024-06-01 NOTE — Telephone Encounter (Signed)
 Pharmacy Patient Advocate Encounter   Received notification from Physician's Office that prior authorization for NURTEC 75MG  is required/requested.   Insurance verification completed.   The patient is insured through [employer] .   Per test claim: PA required; PA submitted to above mentioned insurance via Fax Key/confirmation #/EOC [id number] Status is pending

## 2024-06-08 ENCOUNTER — Other Ambulatory Visit (HOSPITAL_COMMUNITY): Payer: Self-pay

## 2024-06-08 NOTE — Telephone Encounter (Signed)
 Pharmacy Patient Advocate Encounter  Received notification from [employer] that Prior Authorization for NURTEC 75MG  has been APPROVED from 9.25.25 to 9.25.27. Ran test claim, Copay is $0. This test claim was processed through [facility]- copay amounts may vary at other pharmacies due to pharmacy/plan contracts, or as the patient moves through the different stages of their insurance plan.   PA #/Case ID/Reference #: [id number]

## 2024-07-06 ENCOUNTER — Other Ambulatory Visit: Payer: Self-pay

## 2024-07-06 ENCOUNTER — Encounter: Payer: Self-pay | Admitting: Allergy

## 2024-07-06 ENCOUNTER — Ambulatory Visit: Admitting: Allergy

## 2024-07-06 ENCOUNTER — Other Ambulatory Visit: Payer: Self-pay | Admitting: Allergy

## 2024-07-06 VITALS — BP 120/72 | HR 83 | Temp 97.9°F | Resp 16 | Ht 62.5 in | Wt 225.0 lb

## 2024-07-06 DIAGNOSIS — L235 Allergic contact dermatitis due to other chemical products: Secondary | ICD-10-CM

## 2024-07-06 DIAGNOSIS — J3089 Other allergic rhinitis: Secondary | ICD-10-CM | POA: Diagnosis not present

## 2024-07-06 DIAGNOSIS — J4520 Mild intermittent asthma, uncomplicated: Secondary | ICD-10-CM | POA: Diagnosis not present

## 2024-07-06 DIAGNOSIS — J302 Other seasonal allergic rhinitis: Secondary | ICD-10-CM

## 2024-07-06 DIAGNOSIS — H1013 Acute atopic conjunctivitis, bilateral: Secondary | ICD-10-CM

## 2024-07-06 MED ORDER — RYALTRIS 665-25 MCG/ACT NA SUSP
NASAL | 3 refills | Status: DC
Start: 2024-07-06 — End: 2024-07-06

## 2024-07-06 MED ORDER — LEVOCETIRIZINE DIHYDROCHLORIDE 2.5 MG/5ML PO SOLN: 5.0000 mg | Freq: Every evening | ORAL | 5 refills | Status: DC

## 2024-07-06 MED ORDER — ALBUTEROL SULFATE HFA 108 (90 BASE) MCG/ACT IN AERS
2.0000 | INHALATION_SPRAY | RESPIRATORY_TRACT | 2 refills | Status: AC | PRN
Start: 2024-07-06 — End: ?

## 2024-07-06 MED ORDER — OLOPATADINE HCL 0.2 % OP SOLN
OPHTHALMIC | 3 refills | Status: AC
Start: 2024-07-06 — End: ?

## 2024-07-06 MED ORDER — RYALTRIS 665-25 MCG/ACT NA SUSP: 2.0000 | Freq: Two times a day (BID) | NASAL | 5 refills | Status: AC | PRN

## 2024-07-06 NOTE — Progress Notes (Signed)
 Follow-up Note  RE: [name] MRN: [id number] DOB: 1975-12-28 Date of Office Visit: 07/06/2024   History of present illness: [name] is a 48 y.o. female presenting today for follow-up of reactive airway, allergic rhinitis with conjunctivitis and contact dermatitis.  She was last seen in the office on 04/06/24 for patch testing which was negative.  Discussed the use of AI scribe software for clinical note transcription with the patient, who gave verbal consent to proceed.  Since her last visit in June, she has been using her albuterol inhaler primarily when exposed to smoke (which is not frequent) or during physical activities such as morning exercise class. She experiences chest burning after running and states she just realized she should have been using her albuterol to activity. She carries her inhaler with her emergency migraine medication for easy access.  She has been using a nasal spray called Ryaltris  every morning, which she finds helpful, especially when she wakes up early to work out. Occasionally, she misses a dose but generally uses it consistently. She also uses eye drops infrequently, but states has had increased use lately with eyes caking up mainly due to emotional events (has had deaths in the family) rather than outdoor exposure.   Review of systems: 10pt ROS negative unless noted above in HPI   Past medical/social/surgical/family history have been reviewed and are unchanged unless specifically indicated below.  No changes  Medication List: Current Outpatient Medications  Medication Sig Dispense Refill   albuterol (VENTOLIN HFA) 108 (90 Base) MCG/ACT inhaler Inhale 2 puffs into the lungs every 4 (four) hours as needed.     Biotin 10 MG CAPS Take 10,000 mcg/day by mouth.     Cholecalciferol (D3 ADULT PO) Take by mouth. gummies     Fremanezumab -vfrm (AJOVY ) 225 MG/1.5ML SOAJ INJECT 225 MG INTO THE SKIN EVERY 28 (TWENTY-EIGHT) DAYS. 1 mL 11    levocetirizine (XYZAL ) 2.5 MG/5ML solution Take 10 mLs (5 mg total) by mouth every evening. 148 mL 5   MAGNESIUM PO Take by mouth 2 (two) times daily. Ionic Magnesium Liquid Extract     Multiple Vitamins-Minerals (ONE-A-DAY WOMENS PO) Take by mouth.     NON FORMULARY Barlean's fish oil     NON FORMULARY Glucosamine chondrotin     NON FORMULARY Organic Plant Protein     NON FORMULARY Liquid Turmeric- Plant Organics     NON FORMULARY Vibrant health-Green Vibrance     NON FORMULARY Force factor-total Beets     NON FORMULARY Vita fusion gummies- B12     NON FORMULARY Sambuscus Black Elderberry Extract     NON FORMULARY Calm Gummies Magnesium Supplement     NON FORMULARY Rise up Energy & Focus Gummies w/ Alpha GPC Lion's Mane & Aswagandha     NON FORMULARY 100 mg. CoQ10 Gummies     NON FORMULARY Collagen Peptides Grass-Fed Powder     nystatin  cream (MYCOSTATIN ) Apply 1 application  topically 2 (two) times daily.     Olopatadine  HCl (PATADAY ) 0.2 % SOLN 1 drop each eye daily as needed for itchy/watery eyes. 2.5 mL 3   Olopatadine -Mometasone  (RYALTRIS ) 665-25 MCG/ACT SUSP 2 sprays each nostril twice a day as needed for runny or stuffy nose. 29 g 3   Rimegepant Sulfate (NURTEC) 75 MG TBDP Take 1 tablet (75 mg total) by mouth daily as needed. 16 tablet 11   No current facility-administered medications for this visit.     Known medication allergies: Allergies  Allergen Reactions  Neosporin [Neomycin-Bacitracin Zn-Polymyx]     itching   Tape     Bandaids, surgical tape-itching   Cats Claw (Uncaria Tomentosa) Rash   Molds & Smuts Rash and Dermatitis   Other Itching   Pemirolast Itching     Physical examination: Blood pressure 120/72, pulse 83, temperature 97.9 F (36.6 C), temperature source Temporal, resp. rate 16, height 5' 2.5 (1.588 m), weight 225 lb (102.1 kg), last menstrual period 09/06/2018, SpO2 99%.  General: Alert, interactive, in no acute distress. HEENT: PERRLA, TMs  pearly gray, turbinates non-edematous without discharge, post-pharynx non erythematous. Neck: Supple without lymphadenopathy. Lungs: Clear to auscultation without wheezing, rhonchi or rales. {no increased work of breathing. CV: Normal S1, S2 without murmurs. Abdomen: Nondistended, nontender. Skin: Warm and dry, without lesions or rashes. Extremities:  No clubbing, cyanosis or edema. Neuro:   Grossly intact.  Diagnostics/Labs:  Spirometry: FEV1: 2.55L 105%, FVC: 2.96L 99%, ratio consistent with nonobstructive pattern  Assessment and plan:   Reactive airway - Have access to albuterol inhaler 2 puffs every 4-6 hours as needed for cough/wheeze/shortness of breath/chest tightness.  Use 15-20 minutes prior to activity.   Monitor frequency of use.   - Would use albuterol prior to known activities like cookouts where you expect there will be large amount of smoke/fumes  Environmental allergies Chronic environmental allergies with perennial symptoms exacerbated seasonally. Zyrtec causes drowsiness.  - Continue avoidance measures for grasses, trees, indoor molds, and cat - Consider allergy  shots as a means of long-term control. - Allergy  shots re-train and reset the immune system to ignore environmental allergens and decrease the resulting immune response to those allergens (sneezing, itchy watery eyes, runny nose, nasal congestion, etc).    - Allergy  shots improve symptoms in 75-85% of patients.  - We can discuss more at a future appointment if the medications are not working for you. - Use Ryaltris  nasal spray for rhinorrhea and nasal congestion.  Ryaltris  is a combination nasal spray with Mometasone  for congestion control and Olopatadine  for drainage control.   Use 2 sprays each nostril twice a day as needed for runny or stuffy nose.  With using nasal sprays point tip of bottle toward eye on same side nostril and lean head slightly forward for best technique.   - Use Allegra 180mg  or Xyzal   5mg  liquid as your antihistamine.  These options have the least sedating properties - Use Pataday  1 drop each eye daily as needed for itchy/watery eyes.    Contact dermatitis Contact dermatitis with known triggers including Neosporin and adhesive tapes.  - Advised against Neosporin and bacitracin use. - patch testing performed and was negative to the allergens tested  Routine follow-up in 6 months or sooner if needed  I appreciate the opportunity to take part in [name]'s care. Please do not hesitate to contact me with questions.  Sincerely,   Danita Brain, MD Allergy /Immunology [facility]

## 2024-07-06 NOTE — Patient Instructions (Addendum)
 Reactive airway - Have access to albuterol inhaler 2 puffs every 4-6 hours as needed for cough/wheeze/shortness of breath/chest tightness.  Use 15-20 minutes prior to activity.   Monitor frequency of use.   - Would use albuterol prior to known activities like cookouts where you expect there will be large amount of smoke/fumes  Environmental allergies Chronic environmental allergies with perennial symptoms exacerbated seasonally. Zyrtec causes drowsiness.  - Continue avoidance measures for grasses, trees, indoor molds, and cat - Consider allergy  shots as a means of long-term control. - Allergy  shots re-train and reset the immune system to ignore environmental allergens and decrease the resulting immune response to those allergens (sneezing, itchy watery eyes, runny nose, nasal congestion, etc).    - Allergy  shots improve symptoms in 75-85% of patients.  - We can discuss more at a future appointment if the medications are not working for you. - Use Ryaltris  nasal spray for rhinorrhea and nasal congestion.  Ryaltris  is a combination nasal spray with Mometasone  for congestion control and Olopatadine  for drainage control.   Use 2 sprays each nostril twice a day as needed for runny or stuffy nose.  With using nasal sprays point tip of bottle toward eye on same side nostril and lean head slightly forward for best technique.   - Use Allegra 180mg  or Xyzal  5mg  liquid as your antihistamine.  These options have the least sedating properties - Use Pataday  1 drop each eye daily as needed for itchy/watery eyes.    Contact dermatitis Contact dermatitis with known triggers including Neosporin and adhesive tapes.  - Advised against Neosporin and bacitracin use. - patch testing performed and was negative to the allergens tested   Routine follow-up in 6 months or sooner if needed

## 2024-07-10 ENCOUNTER — Other Ambulatory Visit: Payer: Self-pay | Admitting: Allergy

## 2024-07-10 ENCOUNTER — Encounter: Payer: Self-pay | Admitting: Allergy

## 2024-07-10 MED ORDER — LEVOCETIRIZINE DIHYDROCHLORIDE 2.5 MG/5ML PO SOLN: ORAL | 5 refills | Status: DC

## 2024-07-11 ENCOUNTER — Other Ambulatory Visit (HOSPITAL_COMMUNITY): Payer: Self-pay

## 2024-07-11 ENCOUNTER — Other Ambulatory Visit: Payer: Self-pay

## 2024-07-11 MED ORDER — LEVOCETIRIZINE DIHYDROCHLORIDE 2.5 MG/5ML PO SOLN: ORAL | 5 refills | Status: DC

## 2024-07-12 ENCOUNTER — Telehealth: Payer: Self-pay

## 2024-07-12 ENCOUNTER — Other Ambulatory Visit (HOSPITAL_COMMUNITY): Payer: Self-pay

## 2024-07-12 NOTE — Telephone Encounter (Signed)
 Pharmacy Patient Advocate Encounter  Received notification from [facility] [employer] that Prior Authorization for Levocetirizine Solution has been CANCELLED due to medication is a plan exclusion and patient can get over the counter.

## 2024-07-20 DIAGNOSIS — M9901 Segmental and somatic dysfunction of cervical region: Secondary | ICD-10-CM | POA: Diagnosis not present

## 2024-07-20 DIAGNOSIS — G43001 Migraine without aura, not intractable, with status migrainosus: Secondary | ICD-10-CM | POA: Diagnosis not present

## 2024-07-20 DIAGNOSIS — M9902 Segmental and somatic dysfunction of thoracic region: Secondary | ICD-10-CM | POA: Diagnosis not present

## 2024-07-20 DIAGNOSIS — R293 Abnormal posture: Secondary | ICD-10-CM | POA: Diagnosis not present

## 2024-07-22 ENCOUNTER — Encounter: Payer: Self-pay | Admitting: Neurology

## 2024-08-08 DIAGNOSIS — M5416 Radiculopathy, lumbar region: Secondary | ICD-10-CM | POA: Diagnosis not present

## 2024-08-08 DIAGNOSIS — M1712 Unilateral primary osteoarthritis, left knee: Secondary | ICD-10-CM | POA: Diagnosis not present

## 2024-09-03 ENCOUNTER — Encounter: Payer: Self-pay | Admitting: Neurology

## 2024-09-16 ENCOUNTER — Other Ambulatory Visit: Payer: Self-pay | Admitting: Allergy

## 2024-09-17 ENCOUNTER — Other Ambulatory Visit: Payer: Self-pay | Admitting: Allergy

## 2024-10-05 ENCOUNTER — Encounter (HOSPITAL_BASED_OUTPATIENT_CLINIC_OR_DEPARTMENT_OTHER): Payer: Self-pay | Admitting: Cardiology

## 2024-10-08 ENCOUNTER — Ambulatory Visit (HOSPITAL_BASED_OUTPATIENT_CLINIC_OR_DEPARTMENT_OTHER): Admitting: Cardiology

## 2024-10-29 ENCOUNTER — Encounter: Payer: BC Managed Care – PPO | Admitting: Family Medicine

## 2024-10-31 ENCOUNTER — Ambulatory Visit: Admitting: Neurology

## 2025-01-02 ENCOUNTER — Ambulatory Visit (HOSPITAL_BASED_OUTPATIENT_CLINIC_OR_DEPARTMENT_OTHER): Admitting: Cardiology

## 2025-01-03 ENCOUNTER — Ambulatory Visit: Admitting: Allergy

## 2025-05-14 ENCOUNTER — Ambulatory Visit (HOSPITAL_BASED_OUTPATIENT_CLINIC_OR_DEPARTMENT_OTHER): Admitting: Obstetrics & Gynecology
